# Patient Record
Sex: Female | Born: 1995 | Race: White | Hispanic: No | Marital: Single | Smoking: Former smoker
Health system: Southern US, Community
[De-identification: ages and names within clinical notes are randomized; demographics above are authoritative.]

## PROBLEM LIST (undated history)

## (undated) DIAGNOSIS — F419 Anxiety disorder, unspecified: Secondary | ICD-10-CM

## (undated) HISTORY — PX: NO PAST SURGERIES: SHX2092

---
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
## 2017-04-05 ENCOUNTER — Encounter (HOSPITAL_COMMUNITY): Payer: Self-pay | Admitting: Emergency Medicine

## 2017-04-05 ENCOUNTER — Ambulatory Visit (HOSPITAL_COMMUNITY)
Admission: EM | Admit: 2017-04-05 | Discharge: 2017-04-05 | Disposition: A | Discharge status: Home / Self Care | Payer: Federal, State, Local not specified - PPO | Attending: Internal Medicine | Admitting: Internal Medicine

## 2017-04-05 DIAGNOSIS — L255 Unspecified contact dermatitis due to plants, except food: Secondary | ICD-10-CM | POA: Diagnosis not present

## 2017-04-05 MED ORDER — PREDNISONE 10 MG (21) PO TBPK: ORAL_TABLET | Freq: Every day | ORAL | 0 refills | Status: DC

## 2017-04-05 NOTE — Discharge Instructions (Addendum)
Prescription for prednisone sent to the [facility] on [address].  Recheck for new fever >100.5, increased pain/redness, or if not starting to improve in the next 48 hours.

## 2017-04-05 NOTE — ED Triage Notes (Signed)
Here for poss poison ivy/oak onset 2 days  Has rash on bilateral arms, right ear, right leg  Denies fevers, chills  Has used OTC calamine lotion w/no relief.   A&O x4... NAD

## 2017-04-05 NOTE — ED Provider Notes (Signed)
[facility]    CSN: [id number] Arrival date & time: 04/05/17  1454     History   Chief Complaint Chief Complaint  Patient presents with  . Poison Ivy    HPI Angel Barber is a 22 y.o. female. She presents today with 2d history of blistery itchy rash, most prominent on right ear and left forearm.  Was out in the woods briefly doing yard work the day before.  New patches keep popping up.  Right ear was sore too until she started putting some neosporin on it.       HPI  History reviewed. No pertinent past medical history.   History reviewed. No pertinent surgical history.   Home Medications    Prior to Admission medications   Medication Sig Start Date End Date Taking? Authorizing Provider  predniSONE (STERAPRED UNI-PAK 21 TAB) 10 MG (21) TBPK tablet Take by mouth daily. 6 tabs daily for 2 days, 5 tabs x 2 days, 4 tabs x 2 days, 3 tabs x 2 days, 2 tabs x 2 days, then 1 tab daily x 2 days 04/05/17   Eustace Moore, MD    Family History Family History  Problem Relation Age of Onset  . Breast cancer Mother 76    negative genetic testing  . Other Father     had a colonoscopy due to bleeding; results were normal  . Breast cancer Other     dx. 50s-60s  . Breast cancer Other   . Rectal cancer Other   . Lung cancer Other   . Other Other     Social History Social History  Substance Use Topics  . Smoking status: Current Every Day Smoker    Types: Cigarettes  . Smokeless tobacco: Never Used  . Alcohol use Yes     Allergies   Patient has no known allergies.   Review of Systems Review of Systems  All other systems reviewed and are negative.    Physical Exam Triage Vital Signs ED Triage Vitals [04/05/17 1512]  Enc Vitals Group     BP 132/87     Pulse Rate 90     Resp 18     Temp 97.8 F (36.6 C)     Temp Source Oral     SpO2 98 %     Weight      Height      Pain Score 6     Pain Loc    Updated Vital Signs BP 132/87 (BP Location: Left  Arm)   Pulse 90   Temp 97.8 F (36.6 C) (Oral)   Resp 18   LMP 03/04/2017   SpO2 98%   Physical Exam  Constitutional: She is oriented to person, place, and time. No distress.  HENT:  Head: Atraumatic.  Eyes:  Conjugate gaze observed, no eye redness/discharge.  Neck: Neck supple.  Cardiovascular: Normal rate.   Pulmonary/Chest: No respiratory distress.  Abdominal: She exhibits no distension.  Musculoskeletal: Normal range of motion.  Neurological: She is alert and oriented to person, place, and time.  Skin: Skin is warm and dry.  Red papular patches, many with vesicles, scattered thickly over left forearm and rarely over right arm and legs.  Right outer ear is swollen/red, with some golden crusting.    Nursing note and vitals reviewed.    UC Treatments / Results   Procedures Procedures (including critical care time) None today   Final Clinical Impressions(s) / UC Diagnoses   Final diagnoses:  Plant  dermatitis    New Prescriptions Discharge Medication List as of 04/05/2017  3:42 PM    START taking these medications   Details  predniSONE (STERAPRED UNI-PAK 21 TAB) 10 MG (21) TBPK tablet Take by mouth daily. 6 tabs daily for 2 days, 5 tabs x 2 days, 4 tabs x 2 days, 3 tabs x 2 days, 2 tabs x 2 days, then 1 tab daily x 2 days, Starting Fri 04/05/2017, Normal       [facility] [address]  Recheck for new fever >100.5, increased pain/redness, or if not starting to improve in the next 48 hours.   Eustace Moore, MD 04/05/17 2136

## 2017-07-30 ENCOUNTER — Emergency Department (HOSPITAL_BASED_OUTPATIENT_CLINIC_OR_DEPARTMENT_OTHER): Payer: Federal, State, Local not specified - PPO

## 2017-07-30 ENCOUNTER — Encounter (HOSPITAL_BASED_OUTPATIENT_CLINIC_OR_DEPARTMENT_OTHER): Payer: Self-pay | Admitting: Emergency Medicine

## 2017-07-30 ENCOUNTER — Emergency Department (HOSPITAL_BASED_OUTPATIENT_CLINIC_OR_DEPARTMENT_OTHER)
Admission: EM | Admit: 2017-07-30 | Discharge: 2017-07-30 | Disposition: A | Discharge status: Home / Self Care | Payer: Federal, State, Local not specified - PPO | Attending: Emergency Medicine | Admitting: Emergency Medicine

## 2017-07-30 DIAGNOSIS — F17210 Nicotine dependence, cigarettes, uncomplicated: Secondary | ICD-10-CM | POA: Insufficient documentation

## 2017-07-30 DIAGNOSIS — Y999 Unspecified external cause status: Secondary | ICD-10-CM | POA: Insufficient documentation

## 2017-07-30 DIAGNOSIS — Y929 Unspecified place or not applicable: Secondary | ICD-10-CM | POA: Insufficient documentation

## 2017-07-30 DIAGNOSIS — S93401A Sprain of unspecified ligament of right ankle, initial encounter: Secondary | ICD-10-CM | POA: Diagnosis not present

## 2017-07-30 DIAGNOSIS — X58XXXA Exposure to other specified factors, initial encounter: Secondary | ICD-10-CM | POA: Diagnosis not present

## 2017-07-30 DIAGNOSIS — Y939 Activity, unspecified: Secondary | ICD-10-CM | POA: Diagnosis not present

## 2017-07-30 DIAGNOSIS — S99911A Unspecified injury of right ankle, initial encounter: Secondary | ICD-10-CM | POA: Diagnosis present

## 2017-07-30 MED ORDER — IBUPROFEN 800 MG PO TABS: 800.0000 mg | ORAL_TABLET | Freq: Three times a day (TID) | ORAL | 0 refills | Status: DC

## 2017-07-30 NOTE — ED Provider Notes (Signed)
[facility]   CSN: [id number] Arrival date & time: 07/30/17  1934     History   Chief Complaint Chief Complaint  Patient presents with  . Ankle Pain    HPI Angel Barber is a 22 y.o. female.  HPI 3 days ago patient reports that she rolled her right ankle inward and fell on it. She has no associated injury. Initially she was elevating and icing it. She reports she has been walking on it but it is or after walking on it for very long. She reports today it looks more swollen and she saw that it was becoming discolored and sought treatment. History reviewed. No pertinent past medical history.  There are no active problems to display for this patient.   History reviewed. No pertinent surgical history.  OB History    No data available       Home Medications    Prior to Admission medications   Medication Sig Start Date End Date Taking? Authorizing Provider  ibuprofen (ADVIL,MOTRIN) 800 MG tablet Take 1 tablet (800 mg total) by mouth 3 (three) times daily. 07/30/17   Arby BarrettePfeiffer, Llesenia Fogal, MD  predniSONE (STERAPRED UNI-PAK 21 TAB) 10 MG (21) TBPK tablet Take by mouth daily. 6 tabs daily for 2 days, 5 tabs x 2 days, 4 tabs x 2 days, 3 tabs x 2 days, 2 tabs x 2 days, then 1 tab daily x 2 days 04/05/17   Eustace MooreMurray, Laura W, MD    Family History Family History  Problem Relation Age of Onset  . Breast cancer Mother 9340       negative genetic testing  . Other Father        had a colonoscopy due to bleeding; results were normal  . Breast cancer Other        dx. 50s-60s  . Breast cancer Other   . Rectal cancer Other   . Lung cancer Other   . Other Other     Social History Social History  Substance Use Topics  . Smoking status: Current Every Day Smoker    Types: Cigarettes  . Smokeless tobacco: Never Used  . Alcohol use Yes     Allergies   Patient has no known allergies.   Review of Systems Review of Systems Constitutional: No fever no  chills Neurologic: No head injury no headaches  Physical Exam Updated Vital Signs BP 129/89 (BP Location: Right Arm)   Pulse 83   Temp 98 F (36.7 C) (Oral)   Resp 20   Ht 5\' 5"  (1.651 m)   Wt 74.8 kg (165 lb)   LMP 06/28/2017   SpO2 100%   BMI 27.46 kg/m   Physical Exam  Constitutional: She is oriented to person, place, and time. She appears well-developed and well-nourished. No distress.  Eyes: EOM are normal.  Pulmonary/Chest: Effort normal.  Musculoskeletal: She exhibits edema and tenderness.  Right lower extremity has moderate swelling to the area of the lateral malleolus and forefoot. Slight greenish purple discoloration of the lateral aspect of the ankle. The foot is warm and dry. To moderate tenderness to palpation at the malleolus. No edema or swelling of the lower leg or knee.  Neurological: She is alert and oriented to person, place, and time. No cranial nerve deficit. She exhibits normal muscle tone. Coordination normal.  Skin: Skin is warm and dry.  Psychiatric: She has a normal mood and affect.     ED Treatments / Results  Labs (all labs  ordered are listed, but only abnormal results are displayed) Labs Reviewed - No data to display  EKG  EKG Interpretation None       Radiology Dg Ankle Complete Right  Result Date: 07/30/2017 CLINICAL DATA:  Right ankle pain/injury 4 days ago EXAM: RIGHT ANKLE - COMPLETE 3+ VIEW COMPARISON:  None. FINDINGS: No fracture or dislocation is seen. The ankle mortise is intact. The base of the fifth metatarsal is unremarkable. Mild lateral soft tissue swelling. IMPRESSION: No fracture or dislocation is seen. Mild lateral soft tissue swelling. Electronically Signed   By: Charline BillsSriyesh  Krishnan M.D.   On: 07/30/2017 20:11    Procedures Procedures (including critical care time)  Medications Ordered in ED Medications - No data to display   Initial Impression / Assessment and Plan / ED Course  I have reviewed the triage vital signs  and the nursing notes.  Pertinent labs & imaging results that were available during my care of the patient were reviewed by me and considered in my medical decision making (see chart for details).      Final Clinical Impressions(s) / ED Diagnoses   Final diagnoses:  Sprain of right ankle, unspecified ligament, initial encounter   X-ray negative. Findings consistent with lateral ankle sprain. Patient is neurovascularly intact. At this time will have patient compress with an Ace wrap and use a Cam Walker. She is counseled on elevating and icing and follow-up plan. New Prescriptions New Prescriptions   IBUPROFEN (ADVIL,MOTRIN) 800 MG TABLET    Take 1 tablet (800 mg total) by mouth 3 (three) times daily.     Arby BarrettePfeiffer, Netasha Wehrli, MD 07/30/17 2048

## 2017-07-30 NOTE — ED Triage Notes (Signed)
Patient states that she fell a few days ago and hurt her right ankle. Patient is concerned because she feels like something is stretching in it now and she has brusing

## 2017-09-13 ENCOUNTER — Encounter (HOSPITAL_BASED_OUTPATIENT_CLINIC_OR_DEPARTMENT_OTHER): Payer: Self-pay

## 2017-09-13 ENCOUNTER — Emergency Department (HOSPITAL_BASED_OUTPATIENT_CLINIC_OR_DEPARTMENT_OTHER)
Admission: EM | Admit: 2017-09-13 | Discharge: 2017-09-13 | Disposition: A | Discharge status: Home / Self Care | Payer: Federal, State, Local not specified - PPO | Attending: Emergency Medicine | Admitting: Emergency Medicine

## 2017-09-13 ENCOUNTER — Emergency Department (HOSPITAL_BASED_OUTPATIENT_CLINIC_OR_DEPARTMENT_OTHER): Payer: Federal, State, Local not specified - PPO

## 2017-09-13 DIAGNOSIS — F17210 Nicotine dependence, cigarettes, uncomplicated: Secondary | ICD-10-CM | POA: Diagnosis not present

## 2017-09-13 DIAGNOSIS — J189 Pneumonia, unspecified organism: Secondary | ICD-10-CM | POA: Diagnosis not present

## 2017-09-13 DIAGNOSIS — R05 Cough: Secondary | ICD-10-CM | POA: Diagnosis present

## 2017-09-13 MED ORDER — AZITHROMYCIN 250 MG PO TABS: 250.0000 mg | ORAL_TABLET | Freq: Every day | ORAL | 0 refills | Status: DC

## 2017-09-13 MED ORDER — IBUPROFEN 400 MG PO TABS
600.0000 mg | ORAL_TABLET | Freq: Once | ORAL | Status: AC
  Administered 2017-09-13: 17:00:00 600 mg via ORAL
  Filled 2017-09-13: qty 1

## 2017-09-13 NOTE — ED Provider Notes (Signed)
[facility]   CSN: [id number] Arrival date & time: 09/13/17  1559     History   Chief Complaint Chief Complaint  Patient presents with  . Cough    HPI Angel Barber is a 22 y.o. female.  HPI  22 year old female presents with chest pain that started last night. She states she's had a cough with clear sputum for about one week. No fevers. She has had some rhinorrhea and about 3 days ago started having left-sided sore throat that lasted for about 2 days. It is gone now. She had some mild neck swelling under her left jaw that she thinks has resolved. If felt point tender to her. Since last night has developed chest pain in her upper mid chest. Seems to worsen if she coughs or moves. No significant shortness of breath. No vomiting. No leg pain, leg swelling, DVT/PE history or recent travel or surgery. She has not a birth control. No miss menstrual cycles.  History reviewed. No pertinent past medical history.  There are no active problems to display for this patient.   History reviewed. No pertinent surgical history.  OB History    No data available       Home Medications    Prior to Admission medications   Medication Sig Start Date End Date Taking? Authorizing Provider  azithromycin (ZITHROMAX) 250 MG tablet Take 1 tablet (250 mg total) by mouth daily. Take first 2 tablets together, then 1 every day until finished. 09/13/17   Pricilla Loveless, MD    Family History Family History  Problem Relation Age of Onset  . Breast cancer Mother 24       negative genetic testing  . Other Father        had a colonoscopy due to bleeding; results were normal  . Breast cancer Other        dx. 50s-60s  . Breast cancer Other   . Rectal cancer Other   . Lung cancer Other   . Other Other     Social History Social History  Substance Use Topics  . Smoking status: Current Every Day Smoker    Types: Cigarettes  . Smokeless tobacco: Never Used  . Alcohol use  Yes     Comment: occ     Allergies   Patient has no known allergies.   Review of Systems Review of Systems  Constitutional: Negative for fever.  HENT: Positive for rhinorrhea. Negative for sore throat.   Respiratory: Positive for cough.   Cardiovascular: Positive for chest pain.  Gastrointestinal: Negative for vomiting.  Musculoskeletal: Positive for neck pain.  All other systems reviewed and are negative.    Physical Exam Updated Vital Signs BP 121/79   Pulse 88   Temp 98.2 F (36.8 C) (Oral)   Resp 18   Ht  (1.651 m)   Wt 81 kg (178 lb 9.2 oz)   LMP  (LMP Unknown)   SpO2 98%   BMI 29.72 kg/m   Physical Exam  Constitutional: She is oriented to person, place, and time. She appears well-developed and well-nourished.  HENT:  Head: Normocephalic and atraumatic.  Right Ear: External ear normal.  Left Ear: External ear normal.  Nose: Nose normal.  Mouth/Throat: Oropharynx is clear and moist. No oropharyngeal exudate.  Eyes: Right eye exhibits no discharge. Left eye exhibits no discharge.  Neck: Normal range of motion. Neck supple.  No neck swelling or skin changes appreciated  Cardiovascular: Normal rate, regular rhythm and  normal heart sounds.   Pulmonary/Chest: Effort normal and breath sounds normal. She has no wheezes. She has no rales. She exhibits tenderness (mild).    Abdominal: Soft. There is no tenderness.  Lymphadenopathy:    She has no cervical adenopathy.  Neurological: She is alert and oriented to person, place, and time.  Skin: Skin is warm and dry.  Nursing note and vitals reviewed.    ED Treatments / Results  Labs (all labs ordered are listed, but only abnormal results are displayed) Labs Reviewed - No data to display  EKG  EKG Interpretation  Date/Time:  Friday September 13 2017 16:35:27 EDT Ventricular Rate:  62 PR Interval:    QRS Duration: 79 QT Interval:  377 QTC Calculation: 383 R Axis:   70 Text Interpretation:  Sinus  rhythm Borderline short PR interval Early repolarization No old tracing to compare Confirmed by Pricilla Loveless [phone]) on 09/13/2017 4:37:39 PM       Radiology Dg Chest 2 View  Result Date: 09/13/2017 CLINICAL DATA:  Productive cough over the last week. Swelling of the left side of the neck. EXAM: CHEST  2 VIEW COMPARISON:  None. FINDINGS: Heart size is normal. Mediastinal shadows are normal. Question mild patchy infiltrate in the right lower lobe. No definite consolidation. No collapse or effusion. Bony structures unremarkable. IMPRESSION: Normal versus mild patchy infiltrate in the right lower lobe. Electronically Signed   By: Paulina Fusi M.D.   On: 09/13/2017 17:52    Procedures Procedures (including critical care time)  Medications Ordered in ED Medications  ibuprofen (ADVIL,MOTRIN) tablet 600 mg (600 mg Oral Given 09/13/17 1633)     Initial Impression / Assessment and Plan / ED Course  I have reviewed the triage vital signs and the nursing notes.  Pertinent labs & imaging results that were available during my care of the patient were reviewed by me and considered in my medical decision making (see chart for details).     Based on chest x-ray findings, will treat for atypical pneumonia with azithromycin. She is otherwise well appearing. Her chest x-ray shows no other significant abnormalities on her ECG is benign. Her chest pain is likely muscular from coughing. I highly doubt ACS, PE, or dissection. She is low risk for PE and PERC negative. Discharge home with return precautions.  Final Clinical Impressions(s) / ED Diagnoses   Final diagnoses:  Atypical pneumonia    New Prescriptions Discharge Medication List as of 09/13/2017  6:00 PM    START taking these medications   Details  azithromycin (ZITHROMAX) 250 MG tablet Take 1 tablet (250 mg total) by mouth daily. Take first 2 tablets together, then 1 every day until finished., Starting Fri 09/13/2017, Print           Pricilla Loveless, MD 09/13/17 2253

## 2017-09-13 NOTE — ED Notes (Signed)
ED Provider at bedside. 

## 2017-09-13 NOTE — ED Triage Notes (Signed)
C/o prod cough x 1 week-swelling to left side of neck and chest sore to touch x 2 days-NAD-steady gait

## 2017-10-11 ENCOUNTER — Ambulatory Visit (HOSPITAL_COMMUNITY)
Admission: EM | Admit: 2017-10-11 | Discharge: 2017-10-11 | Disposition: A | Discharge status: Home / Self Care | Payer: Federal, State, Local not specified - PPO | Attending: Family | Admitting: Family

## 2017-10-11 ENCOUNTER — Ambulatory Visit (INDEPENDENT_AMBULATORY_CARE_PROVIDER_SITE_OTHER): Payer: Federal, State, Local not specified - PPO

## 2017-10-11 ENCOUNTER — Encounter (HOSPITAL_COMMUNITY): Payer: Self-pay | Admitting: Emergency Medicine

## 2017-10-11 DIAGNOSIS — S82891A Other fracture of right lower leg, initial encounter for closed fracture: Secondary | ICD-10-CM | POA: Diagnosis not present

## 2017-10-11 NOTE — ED Triage Notes (Signed)
Pt reports she inj  Her right ankle 2 days.... sts she stepped on a pothole and twisted ankle.  Sx today include: swelling, pain.... Pain increases w/activity   A&O x4... NAd... Ambulatory

## 2017-10-11 NOTE — Discharge Instructions (Signed)
Crutches Boot Non weight bearing until evaluated by orthopedics Ibuprofen OTC, Ice, Ace Wrap Call orthopedics today and schedule appointment  If there is no improvement in your symptoms, or if there is any worsening of symptoms, or if you have any additional concerns, please return for re-evaluation; or, if we are closed, consider going to the Emergency Room for evaluation if symptoms urgent.

## 2017-10-11 NOTE — ED Provider Notes (Signed)
[facility]    CSN: [id number] Arrival date & time: 10/11/17  1120     History   Chief Complaint Chief Complaint  Patient presents with  . Ankle Pain    HPI Angel Barber is a 22 y.o. female.   Chief complaint of right ankle pain, unchanged. Patient describes an injury today 2 days ago she stepped in "a pot hole" and twisted ankle with inversion. Didn't recalls any cracks or pops during injury. No head injury. She describes swelling, pain. Pain increases with activity, worse with walking. Throbs when sitting still.  Tried ice, elevation, ibuprofen with  No relief.  2 notes months ago similar injury to right ankle; no fracture.       History reviewed. No pertinent past medical history.  There are no active problems to display for this patient.   History reviewed. No pertinent surgical history.  OB History    No data available       Home Medications    Prior to Admission medications   Medication Sig Start Date End Date Taking? Authorizing Provider  azithromycin (ZITHROMAX) 250 MG tablet Take 1 tablet (250 mg total) by mouth daily. Take first 2 tablets together, then 1 every day until finished. 09/13/17   Pricilla Loveless, MD    Family History Family History  Problem Relation Age of Onset  . Breast cancer Mother 23       negative genetic testing  . Other Father        had a colonoscopy due to bleeding; results were normal  . Breast cancer Other        dx. 50s-60s  . Breast cancer Other   . Rectal cancer Other   . Lung cancer Other   . Other Other     Social History Social History  Substance Use Topics  . Smoking status: Current Every Day Smoker    Types: Cigarettes  . Smokeless tobacco: Never Used  . Alcohol use Yes     Comment: occ     Allergies   Patient has no known allergies.   Review of Systems Review of Systems  Constitutional: Negative for chills and fever.  Respiratory: Negative for cough and shortness of breath.     Cardiovascular: Negative for chest pain and palpitations.  Gastrointestinal: Negative for nausea and vomiting.  Musculoskeletal: Positive for joint swelling.     Physical Exam Triage Vital Signs ED Triage Vitals  Enc Vitals Group     BP 10/11/17 1152 (!) 148/80     Pulse Rate 10/11/17 1152 93     Resp 10/11/17 1152 20     Temp 10/11/17 1152 98 F (36.7 C)     Temp Source 10/11/17 1152 Oral     SpO2 10/11/17 1152 100 %     Weight --      Height --      Head Circumference --      Peak Flow --      Pain Score 10/11/17 1154 8     Pain Loc --      Pain Edu? --      Excl. in GC? --    No data found.   Updated Vital Signs BP (!) 148/80 (BP Location: Left Arm)   Pulse 93   Temp 98 F (36.7 C) (Oral)   Resp 20   LMP 10/04/2017 (Exact Date)   SpO2 100%   Visual Acuity Right Eye Distance:   Left Eye Distance:   Bilateral Distance:  Right Eye Near:   Left Eye Near:    Bilateral Near:     Physical Exam  Constitutional: She appears well-developed and well-nourished.  Eyes: Conjunctivae are normal.  Cardiovascular: Normal rate, regular rhythm, normal heart sounds and normal pulses.   Pulmonary/Chest: Effort normal and breath sounds normal. She has no wheezes. She has no rhonchi. She has no rales.  Musculoskeletal:       Right ankle: She exhibits decreased range of motion and swelling. She exhibits no ecchymosis. Tenderness. Lateral malleolus tenderness found.  No pain with Squeeze test at mid calf. Pain over lateral malleolus. No pain over medial malleolus, base of the fifth metatarsal or navicular bone.   Pain with plantar and dorsi flexion.   No swellling or asymmetry of calves. Sensation intact equally bilateral lower extremities. Palpable pedal pulses.   Neurological: She is alert.  Skin: Skin is warm and dry.  Psychiatric: She has a normal mood and affect. Her speech is normal and behavior is normal. Thought content normal.  Vitals reviewed.    UC  Treatments / Results  Labs (all labs ordered are listed, but only abnormal results are displayed) Labs Reviewed - No data to display  EKG  EKG Interpretation None       Radiology Dg Ankle Complete Right  Result Date: 10/11/2017 CLINICAL DATA:  Right ankle pain/ injury EXAM: RIGHT ANKLE - COMPLETE 3+ VIEW COMPARISON:  None. FINDINGS: 3 mm osseous density adjacent to the lateral malleolus may reflect a tiny avulsion fracture. Associated lateral soft tissue swelling. The ankle mortise is intact. The base of the fifth metatarsal is unremarkable. IMPRESSION: Possible tiny avulsion fracture involving the lateral malleolus. Associated lateral soft tissue swelling. Electronically Signed   By: Charline Bills M.D.   On: 10/11/2017 12:14    Procedures Procedures (including critical care time)  Medications Ordered in UC Medications - No data to display   Initial Impression / Assessment and Plan / UC Course  I have reviewed the triage vital signs and the nursing notes.  Pertinent labs & imaging results that were available during my care of the patient were reviewed by me and considered in my medical decision making (see chart for details).       Final Clinical Impressions(s) / UC Diagnoses   Final diagnoses:  Closed avulsion fracture of right ankle, initial encounter   Avulsion fracture not noted on prior x-rays done in July of this year. Advised patient to be nonweightbearing until she is evaluated by orthopedics. She has a boot from this [employer] which she will wear. Given her crutches today. Advised ice, ibuprofen. Advised no driving. Patient understands to follow with orthopedics.  New Prescriptions New Prescriptions   No medications on file     Controlled Substance Prescriptions [place] Controlled Substance Registry consulted? Not Applicable   Allegra Grana, FNP 10/11/17 1236

## 2018-03-31 ENCOUNTER — Encounter (HOSPITAL_COMMUNITY): Payer: Self-pay | Admitting: *Deleted

## 2018-03-31 ENCOUNTER — Emergency Department (HOSPITAL_COMMUNITY): Payer: Federal, State, Local not specified - PPO

## 2018-03-31 ENCOUNTER — Emergency Department (HOSPITAL_COMMUNITY)
Admission: EM | Admit: 2018-03-31 | Discharge: 2018-03-31 | Disposition: A | Discharge status: Home / Self Care | Payer: Federal, State, Local not specified - PPO | Attending: Emergency Medicine | Admitting: Emergency Medicine

## 2018-03-31 ENCOUNTER — Other Ambulatory Visit: Payer: Self-pay

## 2018-03-31 DIAGNOSIS — R197 Diarrhea, unspecified: Secondary | ICD-10-CM | POA: Insufficient documentation

## 2018-03-31 DIAGNOSIS — R0789 Other chest pain: Secondary | ICD-10-CM | POA: Insufficient documentation

## 2018-03-31 DIAGNOSIS — R112 Nausea with vomiting, unspecified: Secondary | ICD-10-CM | POA: Diagnosis not present

## 2018-03-31 DIAGNOSIS — F17210 Nicotine dependence, cigarettes, uncomplicated: Secondary | ICD-10-CM | POA: Insufficient documentation

## 2018-03-31 DIAGNOSIS — R062 Wheezing: Secondary | ICD-10-CM | POA: Diagnosis not present

## 2018-03-31 DIAGNOSIS — J029 Acute pharyngitis, unspecified: Secondary | ICD-10-CM | POA: Diagnosis not present

## 2018-03-31 DIAGNOSIS — M79602 Pain in left arm: Secondary | ICD-10-CM | POA: Insufficient documentation

## 2018-03-31 DIAGNOSIS — R0981 Nasal congestion: Secondary | ICD-10-CM | POA: Insufficient documentation

## 2018-03-31 DIAGNOSIS — R05 Cough: Secondary | ICD-10-CM | POA: Diagnosis not present

## 2018-03-31 DIAGNOSIS — R000 Tachycardia, unspecified: Secondary | ICD-10-CM | POA: Diagnosis not present

## 2018-03-31 DIAGNOSIS — R059 Cough, unspecified: Secondary | ICD-10-CM

## 2018-03-31 DIAGNOSIS — R1084 Generalized abdominal pain: Secondary | ICD-10-CM | POA: Insufficient documentation

## 2018-03-31 LAB — COMPREHENSIVE METABOLIC PANEL
ALBUMIN: 4 g/dL (ref 3.5–5.0)
ALT: 28 U/L (ref 14–54)
AST: 28 U/L (ref 15–41)
Alkaline Phosphatase: 75 U/L (ref 38–126)
Anion gap: 13 (ref 5–15)
BILIRUBIN TOTAL: 0.6 mg/dL (ref 0.3–1.2)
BUN: 11 mg/dL (ref 6–20)
CO2: 22 mmol/L (ref 22–32)
CREATININE: 0.88 mg/dL (ref 0.44–1.00)
Calcium: 9 mg/dL (ref 8.9–10.3)
Chloride: 101 mmol/L (ref 101–111)
GFR calc Af Amer: >60 mL/min (ref >60)
GLUCOSE: 110 mg/dL — AB (ref 65–99)
Potassium: 4.1 mmol/L (ref 3.5–5.1)
Sodium: 136 mmol/L (ref 135–145)
Total Protein: 7 g/dL (ref 6.5–8.1)

## 2018-03-31 LAB — URINALYSIS, ROUTINE W REFLEX MICROSCOPIC
Bilirubin Urine: NEGATIVE
Glucose, UA: NEGATIVE mg/dL
Ketones, ur: NEGATIVE mg/dL
LEUKOCYTES UA: NEGATIVE
Nitrite: NEGATIVE
PH: 5 (ref 5.0–8.0)
Protein, ur: NEGATIVE mg/dL
SPECIFIC GRAVITY, URINE: 1.016 (ref 1.005–1.030)

## 2018-03-31 LAB — CBC
HEMATOCRIT: 42.6 % (ref 36.0–46.0)
Hemoglobin: 14.1 g/dL (ref 12.0–15.0)
MCH: 29.9 pg (ref 26.0–34.0)
MCHC: 33.1 g/dL (ref 30.0–36.0)
MCV: 90.4 fL (ref 78.0–100.0)
PLATELETS: 234 10*3/uL (ref 150–400)
RBC: 4.71 MIL/uL (ref 3.87–5.11)
RDW: 13.5 % (ref 11.5–15.5)
WBC: 14.3 10*3/uL — AB (ref 4.0–10.5)

## 2018-03-31 LAB — LIPASE, BLOOD: Lipase: 31 U/L (ref 11–51)

## 2018-03-31 LAB — I-STAT BETA HCG BLOOD, ED (MC, WL, AP ONLY): I-stat hCG, quantitative: <5 m[IU]/mL (ref <5)

## 2018-03-31 MED ORDER — KETOROLAC TROMETHAMINE 30 MG/ML IJ SOLN
30.0000 mg | Freq: Once | INTRAMUSCULAR | Status: AC
  Administered 2018-03-31: 30 mg via INTRAVENOUS
  Filled 2018-03-31: qty 1

## 2018-03-31 MED ORDER — DICYCLOMINE HCL 20 MG PO TABS: 20.0000 mg | ORAL_TABLET | Freq: Three times a day (TID) | ORAL | 0 refills | Status: DC | PRN

## 2018-03-31 MED ORDER — SODIUM CHLORIDE 0.9 % IV BOLUS
2000.0000 mL | Freq: Once | INTRAVENOUS | Status: AC
  Administered 2018-03-31: 2000 mL via INTRAVENOUS

## 2018-03-31 MED ORDER — OMEPRAZOLE 10 MG PO CPDR: 10.0000 mg | DELAYED_RELEASE_CAPSULE | Freq: Every day | ORAL | 0 refills | Status: DC

## 2018-03-31 MED ORDER — ONDANSETRON 4 MG PO TBDP: 4.0000 mg | ORAL_TABLET | Freq: Three times a day (TID) | ORAL | 0 refills | Status: DC | PRN

## 2018-03-31 NOTE — ED Provider Notes (Signed)
[facility] EMERGENCY DEPARTMENT Provider Note   CSN: [id number] Arrival date & time: 03/31/18  0346  History   Chief Complaint Chief Complaint  Patient presents with  . Abdominal Pain    HPI Angel Barber is a 23 y.o. female.  HPI   Patient presents with abdominal and L flank pain. Intermittent over past few weeks. Abdominal pain is generalized. Reports 3-4 episodes of vomiting over this time, typically after drinking 3-4 beers. Notes "spots of blood" in emesis during these episodes. No vomiting otherwise, but does endorse nausea. Last episode of vomiting ~5hrs ago. Endorses intermittent diarrhea as well. Last regular BM 1hr ago. Endorses decreased appetite. Drinks primarily water, however this worsens her stomach pain, so she has not been drinking much recently. Last had anything to eat or drink ~14hrs ago. Took OTC pain med her mother gave her yesterday for her flank pain but this was not helpful and is the only thing she has taken to try to improve these symptoms. LMP two weeks ago. Is sexually active and does not use any form of contraception. Denies vaginal discharge, dysuria, hematuria, increased urinary frequency. Also endorses cough for roughly the past month with accompanying wheezing at times. Wheeze clears with coughing. Cough productive of white sputum. Endorses chest pain after coughing a lot but no chest pain otherwise. Denies SOB, difficulty breathing. Says she had similar sx last year and was diagnosed with PNA and given prednisone and azithro. Had some prednisone tabs left which she took to help with cough; helped somewhat but did not resolve symptoms. Has not taken anything else to help with symptoms. Endorses nasal congestion, sore throat when coughing. Denies fevers.   History reviewed. No pertinent past medical history.  There are no active problems to display for this patient.   History reviewed. No pertinent surgical history.   OB History    None      Home Medications    Prior to Admission medications   Medication Sig Start Date End Date Taking? Authorizing Provider  dicyclomine (BENTYL) 20 MG tablet Take 1 tablet (20 mg total) by mouth 3 (three) times daily as needed for spasms. 03/31/18 03/31/19  Marquette Saa, MD  omeprazole (PRILOSEC) 10 MG capsule Take 1 capsule (10 mg total) by mouth daily. 03/31/18 04/30/18  Marquette Saa, MD  ondansetron (ZOFRAN-ODT) 4 MG disintegrating tablet Take 1 tablet (4 mg total) by mouth every 8 (eight) hours as needed for nausea or vomiting. 03/31/18   Marquette Saa, MD    Family History Family History  Problem Relation Age of Onset  . Breast cancer Mother 21       negative genetic testing  . Other Father        had a colonoscopy due to bleeding; results were normal  . Breast cancer Other        dx. 50s-60s  . Breast cancer Other   . Rectal cancer Other   . Lung cancer Other   . Other Other     Social History Social History   Tobacco Use  . Smoking status: Current Every Day Smoker    Types: Cigarettes  . Smokeless tobacco: Never Used  Substance Use Topics  . Alcohol use: Yes    Comment: occ  . Drug use: No     Allergies   Patient has no known allergies.   Review of Systems Review of Systems  Constitutional: Positive for appetite change. Negative for fever.  HENT: Positive for  rhinorrhea and sore throat.        "Clogged ears"  Respiratory: Positive for cough and wheezing. Negative for shortness of breath.   Cardiovascular: Negative for chest pain.  Gastrointestinal: Positive for abdominal pain, diarrhea, nausea and vomiting. Negative for constipation.  Genitourinary: Positive for flank pain. Negative for dysuria, frequency, hematuria, pelvic pain, urgency and vaginal discharge.     Physical Exam Updated Vital Signs BP 117/67 (BP Location: Right Arm)   Pulse (!) 101   Temp 99.1 F (37.3 C) (Oral)   Resp 16   Ht 5\' 5"  (1.651 m)    Wt 81.6 kg (180 lb)   LMP 03/09/2018   SpO2 99%   BMI 29.95 kg/m   Physical Exam  Constitutional: She is oriented to person, place, and time. She appears well-developed and well-nourished. No distress.  HENT:  Head: Normocephalic and atraumatic.  Right Ear: External ear normal.  Left Ear: External ear normal.  Nose: Nose normal.  Mouth/Throat: Oropharynx is clear and moist. No oropharyngeal exudate.  Eyes: Pupils are equal, round, and reactive to light. Conjunctivae and EOM are normal. Right eye exhibits no discharge. Left eye exhibits no discharge.  Neck: Normal range of motion. Neck supple.  Cardiovascular: Regular rhythm, normal heart sounds and intact distal pulses.  No murmur heard. Tachycardic  Pulmonary/Chest: Effort normal and breath sounds normal. No respiratory distress. She has no wheezes.  Abdominal: Soft. Normal appearance and bowel sounds are normal. She exhibits no mass. There is no tenderness. There is no guarding and no CVA tenderness.  Mild TTP L flank.   Musculoskeletal:  5/5 strength upper and lower extremities bilaterally  Lymphadenopathy:    She has no cervical adenopathy.  Neurological: She is alert and oriented to person, place, and time.  Skin: Skin is warm and dry. Capillary refill takes less than 2 seconds. No rash noted.  Psychiatric: She has a normal mood and affect. Her behavior is normal.  Nursing note and vitals reviewed.    ED Treatments / Results  Labs (all labs ordered are listed, but only abnormal results are displayed) Labs Reviewed  COMPREHENSIVE METABOLIC PANEL - Abnormal; Notable for the following components:      Result Value   Glucose, Bld 110 (*)    All other components within normal limits  CBC - Abnormal; Notable for the following components:   WBC 14.3 (*)    All other components within normal limits  URINALYSIS, ROUTINE W REFLEX MICROSCOPIC - Abnormal; Notable for the following components:   Hgb urine dipstick SMALL (*)     Bacteria, UA RARE (*)    Squamous Epithelial / LPF 0-5 (*)    All other components within normal limits  LIPASE, BLOOD  I-STAT BETA HCG BLOOD, ED ([facility], [facility], [facility] ONLY)    EKG EKG Interpretation  Date/Time:  Monday March 31 2018 04:00:56 EDT Ventricular Rate:  136 PR Interval:  118 QRS Duration: 72 QT Interval:  270 QTC Calculation: 406 R Axis:   81 Text Interpretation:  Sinus tachycardia Abnormal ekg Confirmed by Gerhard MunchLockwood, Robert [id number]) on 03/31/2018 8:25:56 AM   Radiology Dg Chest 2 View  Result Date: 03/31/2018 CLINICAL DATA:  Left-sided chest pain with cough, initial encounter EXAM: CHEST - 2 VIEW COMPARISON:  09/13/2017 FINDINGS: The heart size and mediastinal contours are within normal limits. Both lungs are clear. The visualized skeletal structures are unremarkable. IMPRESSION: No active cardiopulmonary disease. Electronically Signed   By: Alcide CleverMark  Lukens M.D.   On: 03/31/2018 09:47  Ct Renal Stone Study  Result Date: 03/31/2018 CLINICAL DATA:  One month history of left flank pain. Nausea and vomiting EXAM: CT ABDOMEN AND PELVIS WITHOUT CONTRAST TECHNIQUE: Multidetector CT imaging of the abdomen and pelvis was performed following the standard protocol without oral or IV contrast. COMPARISON:  None. FINDINGS: Lower chest: Lung bases are clear. Hepatobiliary: Liver measures 20.2 cm in length. No focal liver lesions are evident on this noncontrast enhanced study. Gallbladder wall is not appreciably thickened. There is no biliary duct dilatation. Pancreas: There is no pancreatic mass or inflammatory focus. Spleen: No splenic lesions are evident. Adrenals/Urinary Tract: Adrenals bilaterally appear normal. There is no apparent renal mass or hydronephrosis on either side. There is a junctional parenchymal defect in each kidney, an anatomic variant. There is no renal or ureteral calculus on either side. Urinary bladder is midline with wall thickness within normal limits. Stomach/Bowel: There is no  appreciable bowel wall or mesenteric thickening. There is no evident bowel obstruction. No free air or portal venous air. Vascular/Lymphatic: There is no abdominal aortic aneurysm. There are no vascular lesions evident. By size criteria, there is no adenopathy in the abdomen or pelvis. There are scattered subcentimeter lymph nodes in the right mid to lower abdomen, regarded as nonspecific. Reproductive: Uterus is anteverted.  No pelvic mass evident. Other: Appendix appears normal. No abscess or ascites is evident in the abdomen or pelvis. Musculoskeletal: No blastic or lytic bone lesions. No intramuscular or abdominal wall lesions are evident. IMPRESSION: 1. No evident renal or ureteral calculus. No hydronephrosis on either side. 2.  Prominent liver without focal lesion. 3. No evident bowel obstruction. No abscess. Appendix appears normal. 4. Scattered right-sided subcentimeter abdominal mesenteric lymph nodes regarded as nonspecific. No adenopathy evident on this study by size criteria. Electronically Signed   By: Bretta Bang III M.D.   On: 03/31/2018 10:11    Procedures Procedures (including critical care time)  Medications Ordered in ED Medications  ketorolac (TORADOL) 30 MG/ML injection 30 mg (has no administration in time range)  sodium chloride 0.9 % bolus 2,000 mL (0 mLs Intravenous Stopped 03/31/18 0934)     Initial Impression / Assessment and Plan / ED Course  I have reviewed the triage vital signs and the nursing notes.  Pertinent labs & imaging results that were available during my care of the patient were reviewed by me and considered in my medical decision making (see chart for details).    23yo F presenting with intermittent generalized abd pain and L flank pain over past 2w. Also with cough x4w. Labs remarkable for leukocytosis (WBC 14) and small Hgb in UA. Also tachycardic to 120s, though afebrile and well-appearing. Given intermittent flank pain, leukocytosis, and hematuria,  will obtain CT renal study to r/o nephrolithiasis. Also reporting decreased PO intake, making dehydration possible cause of tachycardia, or at least contributor, as well, though MMM on exam. Patient does report recently taking prednisone, which could be contributing to leukocytosis as well.   Upreg neg. Denies dysuria, no suprapubic or CVA tenderness, and UA with no signs of infection, so UTI less likely. Also denies vaginal discharge or pelvic pain, so STD less likely. Intermittent nature of vomiting, especially after consuming alcohol, could be consistent with gastritis. Gastric ulcer also on differential, so could consider beginning PPI if CT neg for renal etiology.   PE on differential given tachycardia and reported cough, however normal WOB on RA with normal O2 sat, no chest pain, no risk factors, and lungs  CTAB, so PE less likely. PNA also less likely as patient afebrile with clear lungs, however will obtain CXR to rule out consolidation.   0865 CXR with no abnormalities.   1026 CT with no hydronephrosis or signs of renal calculi. Prominent liver noted but without lesions or other abnormalities. HR improved with IVF. Patient continues to appear well but is requesting pain medication; will given Toradol x1 prior to discharge. Will discharge on PPI as well as Bentyl for abd cramping. Can taken ibuprofen or Tylenol for abd pain and flank pain. Discharge with Zofran ODT as well for N/V. Encouraged to establish care with PCP.    Final Clinical Impressions(s) / ED Diagnoses   Final diagnoses:  Generalized abdominal pain  Cough    ED Discharge Orders        Ordered    dicyclomine (BENTYL) 20 MG tablet  3 times daily PRN     03/31/18 1050    ondansetron (ZOFRAN-ODT) 4 MG disintegrating tablet  Every 8 hours PRN     03/31/18 1050    omeprazole (PRILOSEC) 10 MG capsule  Daily     03/31/18 1050     Tarri Abernethy, MD, MPH PGY-3 [facility] Pager [phone]    Marquette Saa, MD 03/31/18 1056    Gerhard Munch, MD 03/31/18 [phone]

## 2018-03-31 NOTE — Discharge Instructions (Signed)
To help with your abdominal and side pain, you can take ibuprofen and/or Tylenol as needed. You can also take Bentyl up to every 8 hours as needed for abdominal cramping or pain. For nausea, you can take one Zofran tablet up to every 8 hours.  Please also begin taking omeprazole (Prilosec) one tablet daily to help with any possible stomach ulcers or acid reflux.  Please call to schedule an appointment with a primary care doctor. [facility] [phone]) and [facility] [phone]) are both accepting new patients currently.  If your abdominal pain or vomiting worsens, please return to the emergency room.

## 2018-03-31 NOTE — ED Triage Notes (Signed)
The pt is c/o abd pain for one month with vomiting and diarrhea  lmp 2 weeks ago also lt rib pain lower

## 2018-06-19 ENCOUNTER — Encounter (HOSPITAL_COMMUNITY): Payer: Self-pay | Admitting: *Deleted

## 2018-06-19 ENCOUNTER — Emergency Department (HOSPITAL_COMMUNITY)
Admission: EM | Admit: 2018-06-19 | Discharge: 2018-06-19 | Disposition: A | Discharge status: Home / Self Care | Payer: Federal, State, Local not specified - PPO | Attending: Emergency Medicine | Admitting: Emergency Medicine

## 2018-06-19 ENCOUNTER — Emergency Department (HOSPITAL_COMMUNITY): Payer: Federal, State, Local not specified - PPO

## 2018-06-19 ENCOUNTER — Other Ambulatory Visit: Payer: Self-pay

## 2018-06-19 DIAGNOSIS — Y999 Unspecified external cause status: Secondary | ICD-10-CM | POA: Diagnosis not present

## 2018-06-19 DIAGNOSIS — F17210 Nicotine dependence, cigarettes, uncomplicated: Secondary | ICD-10-CM | POA: Insufficient documentation

## 2018-06-19 DIAGNOSIS — Y9339 Activity, other involving climbing, rappelling and jumping off: Secondary | ICD-10-CM | POA: Diagnosis not present

## 2018-06-19 DIAGNOSIS — S8992XA Unspecified injury of left lower leg, initial encounter: Secondary | ICD-10-CM | POA: Diagnosis present

## 2018-06-19 DIAGNOSIS — X500XXA Overexertion from strenuous movement or load, initial encounter: Secondary | ICD-10-CM | POA: Diagnosis not present

## 2018-06-19 DIAGNOSIS — Y92511 Restaurant or cafe as the place of occurrence of the external cause: Secondary | ICD-10-CM | POA: Insufficient documentation

## 2018-06-19 MED ORDER — HYDROCODONE-ACETAMINOPHEN 5-325 MG PO TABS: 2.0000 | ORAL_TABLET | ORAL | 0 refills | Status: DC | PRN
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
## 2018-06-19 NOTE — Discharge Instructions (Addendum)
Your x-ray shows signs of possible ligament injury to your left knee.  Wear the knee immobilizer at all times.  Use the crutches for nonweightbearing. Please rest, ice, compress and elevated the affected body part to help with swelling and pain.  I would take anti-inflammatory such as Aleve, Motrin and ibuprofen around-the-clock. Have given you short course of stronger pain medication that will make you drowsy so do not drive with it.  It is very important for you to follow-up with orthopedic doctor.  Have provided their contact information.  Return the ED if you develop any worsening symptoms.
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
## 2018-11-24 IMAGING — CR DG KNEE COMPLETE 4+V*L*
4 series · 4 of 4 positions shown · non-contrast
Comparison: None.

CLINICAL DATA: Patient was picked up and then put down. Patient
esophagitis sting injury and currently cannot bear weight. Pain is
just below the patella.

EXAM:
LEFT KNEE - COMPLETE 4+ VIEW

[x knee ap left]
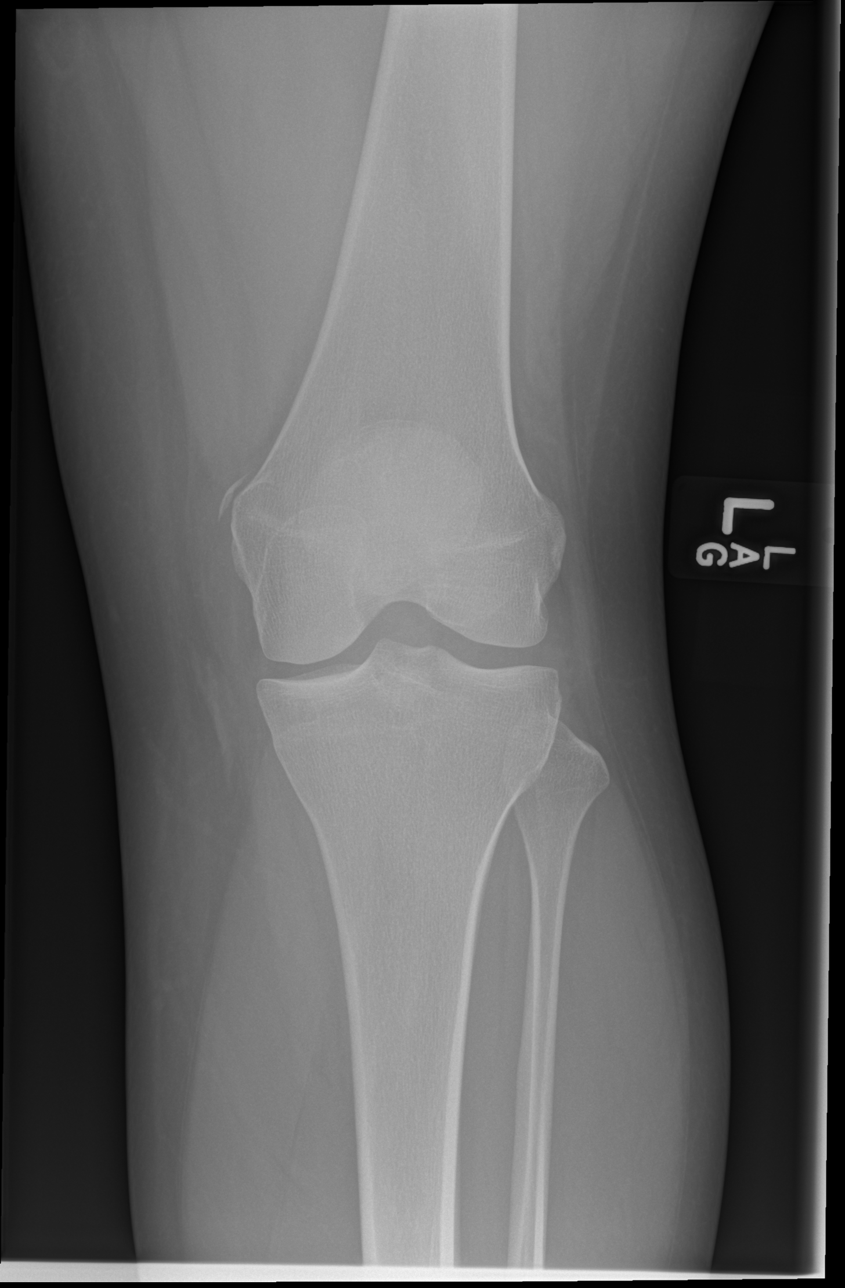

[x knee obl left (1 of 2)]
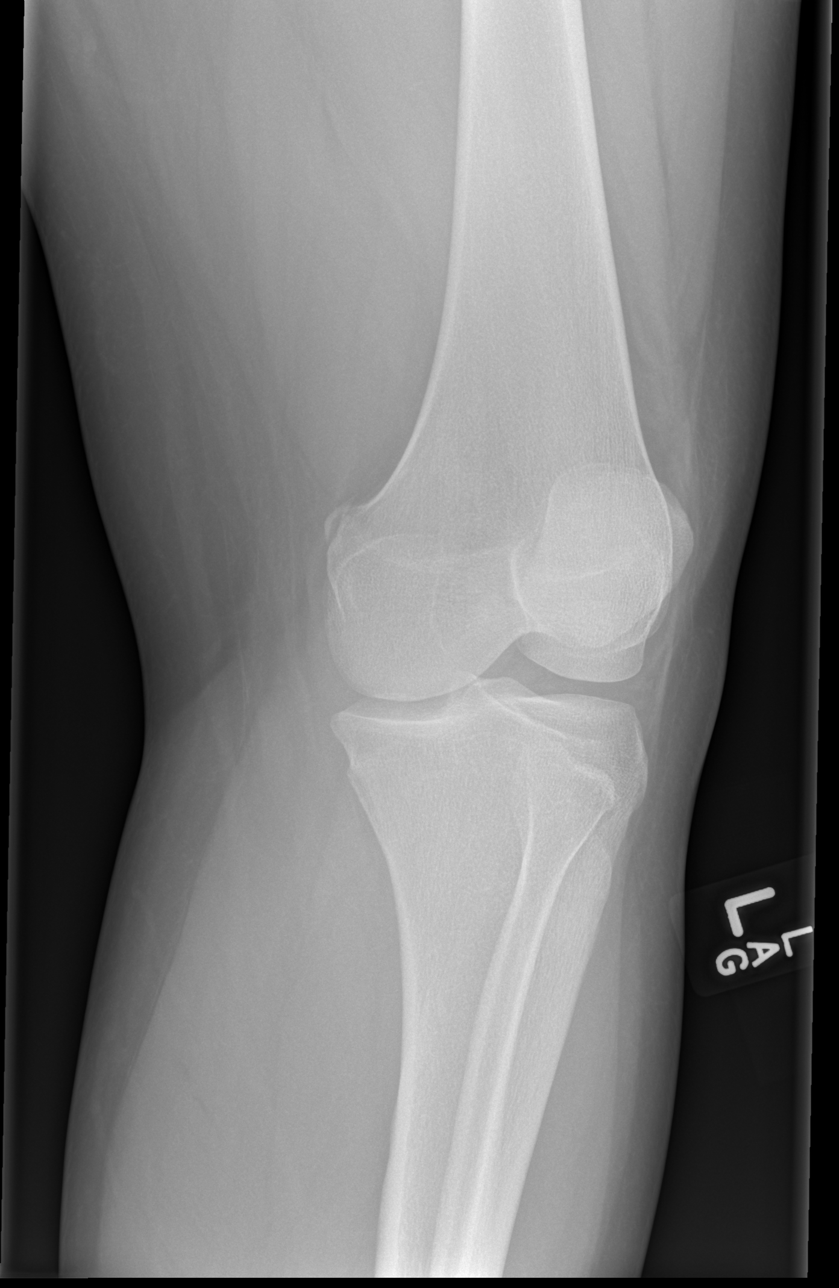

[x knee obl left (2 of 2)]
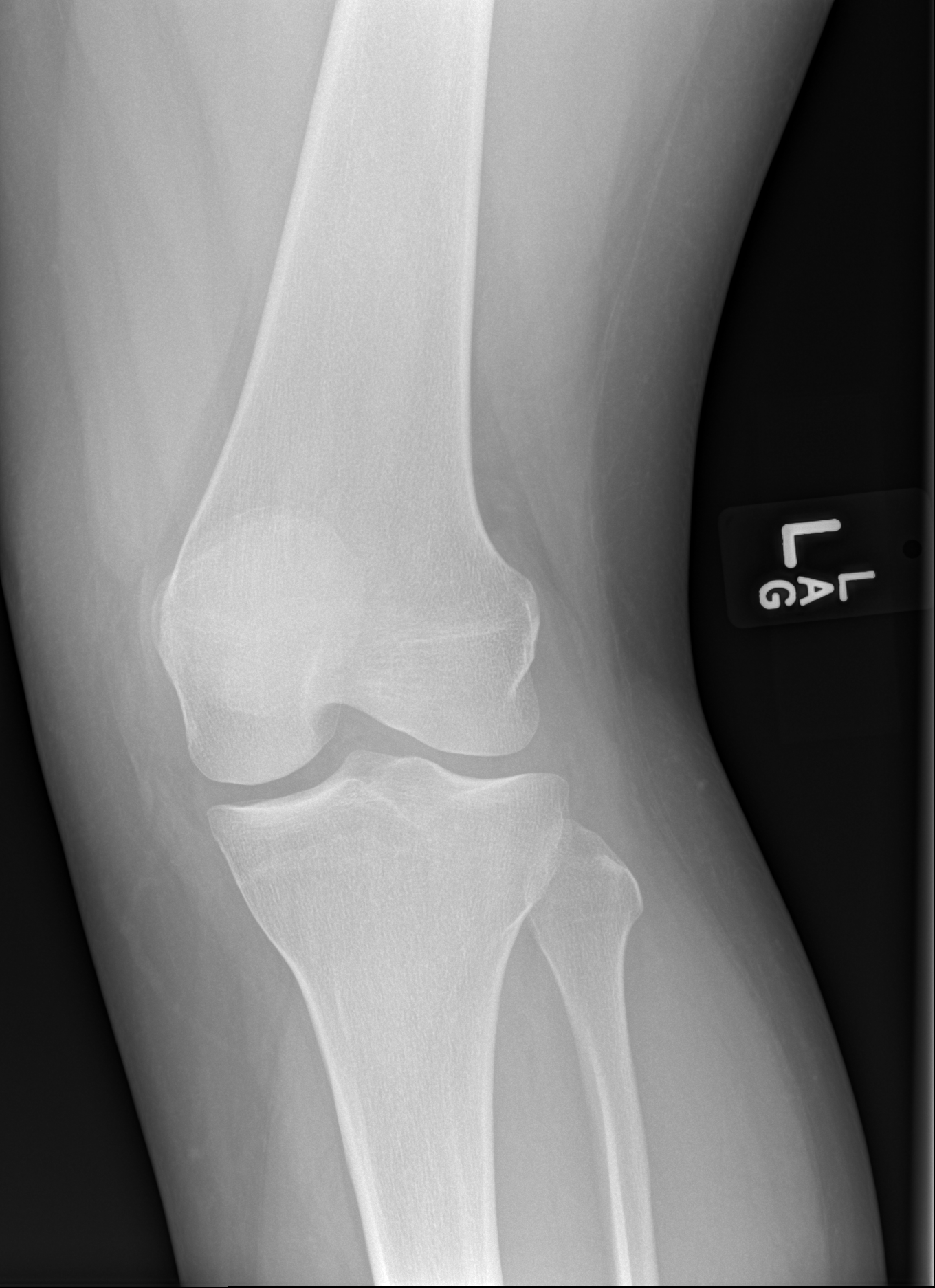

[x knee lat left]
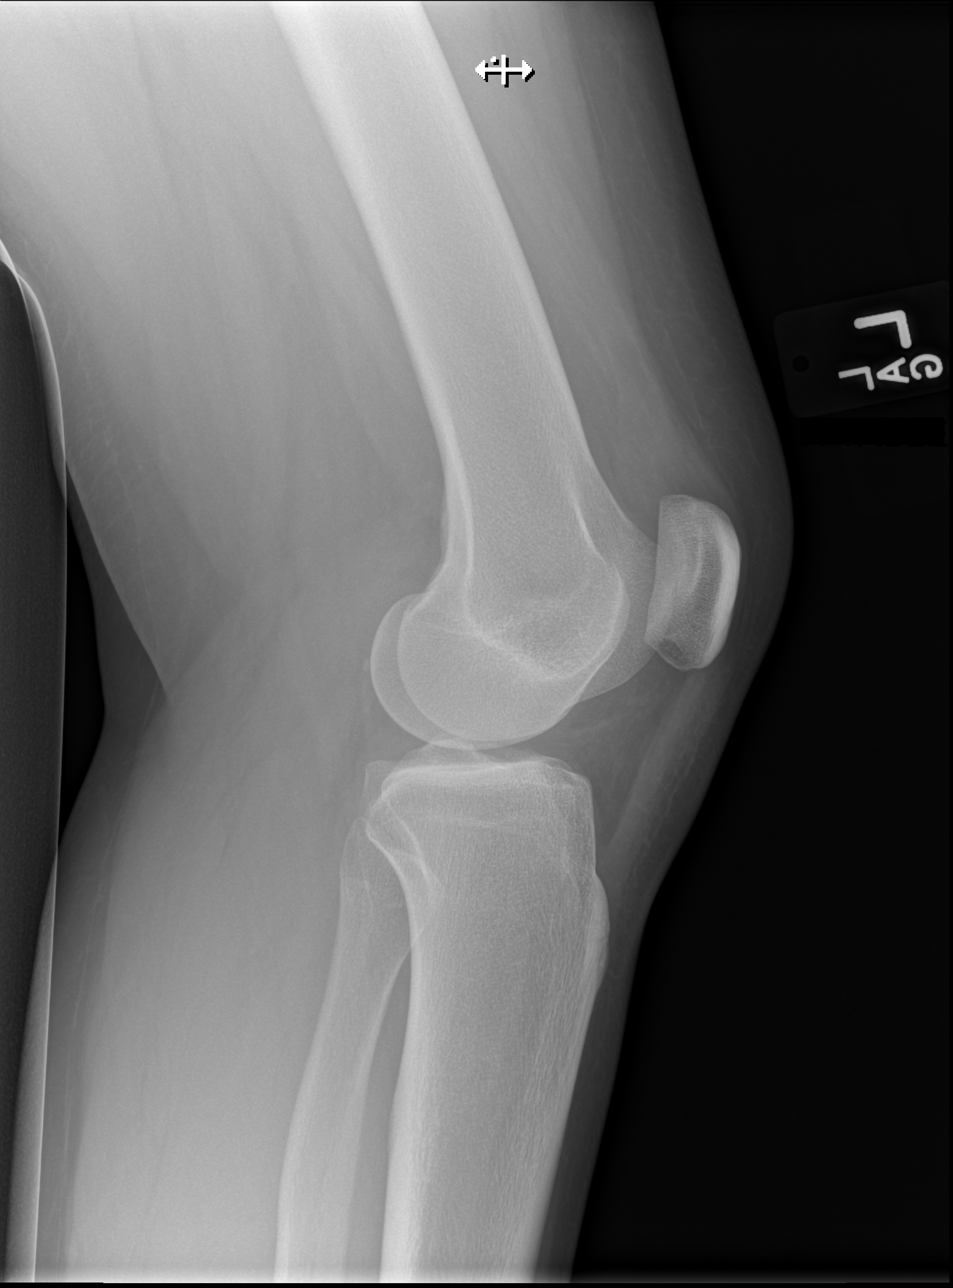

[4 of 4 positions shown; findings below may reference images not displayed]

FINDINGS: Avulsion fracture off the medial condyle along the course of the
medial collateral ligament is identified consistent with a Stieda
fracture. This is age indeterminate. The femorotibial compartment is
maintained. No tibial plateau fracture is noted. Trace joint
effusion is present. Slight soft tissue induration overlying the
patellar tendon is noted without tear or retraction. Patella is
normally situated.
IMPRESSION: 1. Age-indeterminate avulsion off the course of the medial
collateral ligament adjacent to the medial femoral condyle. Given
presenting history of infrapatellar pain, this may be subacute to
chronic in etiology.
2. Irregularity along the superficial aspect of the patellar tendon
possibly representing tendinosis or partial tear given history of
infrapatellar pain.

## 2019-01-08 DIAGNOSIS — F411 Generalized anxiety disorder: Secondary | ICD-10-CM | POA: Diagnosis not present

## 2019-01-08 DIAGNOSIS — F332 Major depressive disorder, recurrent severe without psychotic features: Secondary | ICD-10-CM | POA: Diagnosis not present

## 2019-01-29 DIAGNOSIS — F332 Major depressive disorder, recurrent severe without psychotic features: Secondary | ICD-10-CM | POA: Diagnosis not present

## 2019-01-29 DIAGNOSIS — F411 Generalized anxiety disorder: Secondary | ICD-10-CM | POA: Diagnosis not present

## 2019-03-03 DIAGNOSIS — F332 Major depressive disorder, recurrent severe without psychotic features: Secondary | ICD-10-CM | POA: Diagnosis not present

## 2019-03-03 DIAGNOSIS — F411 Generalized anxiety disorder: Secondary | ICD-10-CM | POA: Diagnosis not present

## 2019-09-30 ENCOUNTER — Ambulatory Visit: Payer: Federal, State, Local not specified - PPO

## 2020-05-08 DIAGNOSIS — J209 Acute bronchitis, unspecified: Secondary | ICD-10-CM | POA: Diagnosis not present

## 2020-05-08 DIAGNOSIS — J0100 Acute maxillary sinusitis, unspecified: Secondary | ICD-10-CM | POA: Diagnosis not present

## 2020-05-08 DIAGNOSIS — Z20828 Contact with and (suspected) exposure to other viral communicable diseases: Secondary | ICD-10-CM | POA: Diagnosis not present

## 2020-10-20 DIAGNOSIS — F32A Depression, unspecified: Secondary | ICD-10-CM | POA: Diagnosis not present

## 2020-10-20 DIAGNOSIS — F419 Anxiety disorder, unspecified: Secondary | ICD-10-CM | POA: Diagnosis not present

## 2020-10-20 DIAGNOSIS — L659 Nonscarring hair loss, unspecified: Secondary | ICD-10-CM | POA: Diagnosis not present

## 2020-10-20 DIAGNOSIS — Z1331 Encounter for screening for depression: Secondary | ICD-10-CM | POA: Diagnosis not present

## 2021-01-02 DIAGNOSIS — Z20828 Contact with and (suspected) exposure to other viral communicable diseases: Secondary | ICD-10-CM | POA: Diagnosis not present

## 2021-01-25 DIAGNOSIS — D72829 Elevated white blood cell count, unspecified: Secondary | ICD-10-CM | POA: Diagnosis not present

## 2021-01-25 DIAGNOSIS — F32A Depression, unspecified: Secondary | ICD-10-CM | POA: Diagnosis not present

## 2021-01-25 DIAGNOSIS — F419 Anxiety disorder, unspecified: Secondary | ICD-10-CM | POA: Diagnosis not present

## 2021-01-25 DIAGNOSIS — Z6836 Body mass index (BMI) 36.0-36.9, adult: Secondary | ICD-10-CM | POA: Diagnosis not present

## 2021-02-27 DIAGNOSIS — Z111 Encounter for screening for respiratory tuberculosis: Secondary | ICD-10-CM | POA: Diagnosis not present

## 2021-11-08 DIAGNOSIS — F32A Depression, unspecified: Secondary | ICD-10-CM | POA: Diagnosis not present

## 2021-11-08 DIAGNOSIS — Z3200 Encounter for pregnancy test, result unknown: Secondary | ICD-10-CM | POA: Diagnosis not present

## 2021-11-08 DIAGNOSIS — F419 Anxiety disorder, unspecified: Secondary | ICD-10-CM | POA: Diagnosis not present

## 2021-11-08 DIAGNOSIS — Z3490 Encounter for supervision of normal pregnancy, unspecified, unspecified trimester: Secondary | ICD-10-CM | POA: Diagnosis not present

## 2021-11-16 DIAGNOSIS — N911 Secondary amenorrhea: Secondary | ICD-10-CM | POA: Diagnosis not present

## 2021-11-16 DIAGNOSIS — Z3201 Encounter for pregnancy test, result positive: Secondary | ICD-10-CM | POA: Diagnosis not present

## 2021-11-16 DIAGNOSIS — Z23 Encounter for immunization: Secondary | ICD-10-CM | POA: Diagnosis not present

## 2021-12-06 DIAGNOSIS — Z3A10 10 weeks gestation of pregnancy: Secondary | ICD-10-CM | POA: Diagnosis not present

## 2021-12-06 DIAGNOSIS — Z3689 Encounter for other specified antenatal screening: Secondary | ICD-10-CM | POA: Diagnosis not present

## 2021-12-06 DIAGNOSIS — Z113 Encounter for screening for infections with a predominantly sexual mode of transmission: Secondary | ICD-10-CM | POA: Diagnosis not present

## 2021-12-06 DIAGNOSIS — O26891 Other specified pregnancy related conditions, first trimester: Secondary | ICD-10-CM | POA: Diagnosis not present

## 2021-12-06 DIAGNOSIS — Z124 Encounter for screening for malignant neoplasm of cervix: Secondary | ICD-10-CM | POA: Diagnosis not present

## 2021-12-06 DIAGNOSIS — Z363 Encounter for antenatal screening for malformations: Secondary | ICD-10-CM | POA: Diagnosis not present

## 2021-12-06 LAB — OB RESULTS CONSOLE ABO/RH: RH Type: POSITIVE

## 2021-12-06 LAB — OB RESULTS CONSOLE ANTIBODY SCREEN: Antibody Screen: NEGATIVE

## 2021-12-06 LAB — OB RESULTS CONSOLE GC/CHLAMYDIA
Chlamydia: NEGATIVE
Neisseria Gonorrhea: NEGATIVE

## 2021-12-06 LAB — OB RESULTS CONSOLE HEPATITIS B SURFACE ANTIGEN: Hepatitis B Surface Ag: NEGATIVE

## 2021-12-06 LAB — OB RESULTS CONSOLE RUBELLA ANTIBODY, IGM: Rubella: IMMUNE

## 2021-12-31 NOTE — L&D Delivery Note (Addendum)
Delivery Note Angel Barber is a G1P0 at [redacted]w[redacted]d who had a spontaneous delivery at 2332 a viable female Angel Barber") was delivered via  OA.  APGAR: 7, 9; weight 7lb 6.2oz (3350g) .     Admitted for term IOL. Induced with cytotec, pitocin, AROM. Progressed normally. Received epidural for pain management. Pushed for 20 minutes. Baby was delivered without difficulty. No nuchal cord.  Delayed cord clamping for 60 seconds.  Delivery of placenta was spontaneous. Placenta was found to be intact, 3 -vessel cord was noted. The fundus was found to be firm. 1st degree perineal laceration was repaired in the normal sterile fashion with 3-0 vicryl. Estimated blood loss 300cc. Instrument and gauze counts were correct at the end of the procedure.  After delivery, maternal temp noted to be 101.62F. Will start amp/gent for presumed chorio.    Placenta status: pathology  Anesthesia:  epidural Episiotomy:  none Lacerations:  1st degree perineal Suture Repair: 3.0 vicryl Est. Blood Loss (mL):  300  Mom to postpartum.  Baby to Couplet care / Skin to Skin.  [name] 07/01/2022, 1:12 AM

## 2022-01-03 DIAGNOSIS — Z3A14 14 weeks gestation of pregnancy: Secondary | ICD-10-CM | POA: Diagnosis not present

## 2022-01-03 DIAGNOSIS — O99212 Obesity complicating pregnancy, second trimester: Secondary | ICD-10-CM | POA: Diagnosis not present

## 2022-01-03 DIAGNOSIS — O209 Hemorrhage in early pregnancy, unspecified: Secondary | ICD-10-CM | POA: Diagnosis not present

## 2022-01-08 DIAGNOSIS — Z3402 Encounter for supervision of normal first pregnancy, second trimester: Secondary | ICD-10-CM | POA: Diagnosis not present

## 2022-01-08 DIAGNOSIS — Z3401 Encounter for supervision of normal first pregnancy, first trimester: Secondary | ICD-10-CM | POA: Diagnosis not present

## 2022-02-12 DIAGNOSIS — O99212 Obesity complicating pregnancy, second trimester: Secondary | ICD-10-CM | POA: Diagnosis not present

## 2022-02-12 DIAGNOSIS — Z3A20 20 weeks gestation of pregnancy: Secondary | ICD-10-CM | POA: Diagnosis not present

## 2022-02-12 DIAGNOSIS — Z363 Encounter for antenatal screening for malformations: Secondary | ICD-10-CM | POA: Diagnosis not present

## 2022-04-04 DIAGNOSIS — Z3689 Encounter for other specified antenatal screening: Secondary | ICD-10-CM | POA: Diagnosis not present

## 2022-04-04 LAB — OB RESULTS CONSOLE HIV ANTIBODY (ROUTINE TESTING): HIV: NONREACTIVE

## 2022-04-04 LAB — OB RESULTS CONSOLE RPR: RPR: NONREACTIVE

## 2022-04-16 DIAGNOSIS — J069 Acute upper respiratory infection, unspecified: Secondary | ICD-10-CM | POA: Diagnosis not present

## 2022-04-23 DIAGNOSIS — O99810 Abnormal glucose complicating pregnancy: Secondary | ICD-10-CM | POA: Diagnosis not present

## 2022-06-13 LAB — OB RESULTS CONSOLE GBS: GBS: NEGATIVE

## 2022-06-29 ENCOUNTER — Other Ambulatory Visit: Payer: Self-pay

## 2022-06-29 ENCOUNTER — Inpatient Hospital Stay (HOSPITAL_COMMUNITY): Payer: Medicaid Other

## 2022-06-29 ENCOUNTER — Other Ambulatory Visit: Payer: Self-pay | Admitting: Obstetrics and Gynecology

## 2022-06-29 ENCOUNTER — Inpatient Hospital Stay (HOSPITAL_COMMUNITY)
Admission: AD | Admit: 2022-06-29 | Discharge: 2022-07-02 | DRG: 805 | Disposition: A | Discharge status: Home / Self Care | Payer: Medicaid Other | Attending: Obstetrics and Gynecology | Admitting: Obstetrics and Gynecology

## 2022-06-29 ENCOUNTER — Encounter (HOSPITAL_COMMUNITY): Payer: Self-pay | Admitting: Obstetrics and Gynecology

## 2022-06-29 DIAGNOSIS — O411230 Chorioamnionitis, third trimester, not applicable or unspecified: Secondary | ICD-10-CM | POA: Diagnosis present

## 2022-06-29 DIAGNOSIS — Z3A39 39 weeks gestation of pregnancy: Secondary | ICD-10-CM | POA: Diagnosis not present

## 2022-06-29 DIAGNOSIS — Z87891 Personal history of nicotine dependence: Secondary | ICD-10-CM

## 2022-06-29 DIAGNOSIS — O99344 Other mental disorders complicating childbirth: Secondary | ICD-10-CM | POA: Diagnosis present

## 2022-06-29 DIAGNOSIS — F419 Anxiety disorder, unspecified: Secondary | ICD-10-CM | POA: Diagnosis present

## 2022-06-29 DIAGNOSIS — O24410 Gestational diabetes mellitus in pregnancy, diet controlled: Secondary | ICD-10-CM

## 2022-06-29 DIAGNOSIS — O24429 Gestational diabetes mellitus in childbirth, unspecified control: Principal | ICD-10-CM | POA: Diagnosis present

## 2022-06-29 DIAGNOSIS — O24419 Gestational diabetes mellitus in pregnancy, unspecified control: Principal | ICD-10-CM | POA: Diagnosis present

## 2022-06-29 DIAGNOSIS — O26893 Other specified pregnancy related conditions, third trimester: Secondary | ICD-10-CM | POA: Diagnosis present

## 2022-06-29 HISTORY — DX: Anxiety disorder, unspecified: F41.9

## 2022-06-29 LAB — TYPE AND SCREEN
ABO/RH(D): O POS
Antibody Screen: NEGATIVE

## 2022-06-29 LAB — CBC
HCT: 31.8 % — ABNORMAL LOW (ref 36.0–46.0)
Hemoglobin: 10.3 g/dL — ABNORMAL LOW (ref 12.0–15.0)
MCH: 25.3 pg — ABNORMAL LOW (ref 26.0–34.0)
MCHC: 32.4 g/dL (ref 30.0–36.0)
MCV: 78.1 fL — ABNORMAL LOW (ref 80.0–100.0)
Platelets: 330 10*3/uL (ref 150–400)
RBC: 4.07 MIL/uL (ref 3.87–5.11)
RDW: 15.4 % (ref 11.5–15.5)
WBC: 12.4 10*3/uL — ABNORMAL HIGH (ref 4.0–10.5)
nRBC: 0 % (ref 0.0–0.2)

## 2022-06-29 MED ORDER — OXYTOCIN-SODIUM CHLORIDE 30-0.9 UT/500ML-% IV SOLN
1.0000 m[IU]/min | INTRAVENOUS | Status: DC
  Administered 2022-06-30: 2 m[IU]/min via INTRAVENOUS
  Filled 2022-06-29 (×2): qty 500

## 2022-06-29 MED ORDER — LIDOCAINE HCL (PF) 1 % IJ SOLN
30.0000 mL | INTRAMUSCULAR | Status: DC | PRN
  Filled 2022-06-29: qty 30

## 2022-06-29 MED ORDER — OXYCODONE-ACETAMINOPHEN 5-325 MG PO TABS: 2.0000 | ORAL_TABLET | ORAL | Status: DC | PRN

## 2022-06-29 MED ORDER — LACTATED RINGERS IV SOLN: INTRAVENOUS | Status: DC

## 2022-06-29 MED ORDER — TERBUTALINE SULFATE 1 MG/ML IJ SOLN: 0.2500 mg | Freq: Once | INTRAMUSCULAR | Status: DC | PRN

## 2022-06-29 MED ORDER — OXYTOCIN BOLUS FROM INFUSION
333.0000 mL | Freq: Once | INTRAVENOUS | Status: AC
  Administered 2022-06-30: 333 mL via INTRAVENOUS

## 2022-06-29 MED ORDER — LACTATED RINGERS IV SOLN
500.0000 mL | INTRAVENOUS | Status: DC | PRN
  Administered 2022-06-30: 1000 mL via INTRAVENOUS

## 2022-06-29 MED ORDER — OXYTOCIN-SODIUM CHLORIDE 30-0.9 UT/500ML-% IV SOLN: 2.5000 [IU]/h | INTRAVENOUS | Status: DC

## 2022-06-29 MED ORDER — MISOPROSTOL 25 MCG QUARTER TABLET
25.0000 ug | ORAL_TABLET | ORAL | Status: DC | PRN
  Administered 2022-06-29 (×2): 25 ug via VAGINAL
  Filled 2022-06-29 (×2): qty 1

## 2022-06-29 MED ORDER — ONDANSETRON HCL 4 MG/2ML IJ SOLN
4.0000 mg | Freq: Four times a day (QID) | INTRAMUSCULAR | Status: DC | PRN
  Administered 2022-06-30: 4 mg via INTRAVENOUS
  Filled 2022-06-29: qty 2

## 2022-06-29 MED ORDER — SOD CITRATE-CITRIC ACID 500-334 MG/5ML PO SOLN
30.0000 mL | ORAL | Status: DC | PRN
  Administered 2022-06-29 – 2022-07-01 (×2): 30 mL via ORAL
  Filled 2022-06-29 (×2): qty 30

## 2022-06-29 MED ORDER — OXYCODONE-ACETAMINOPHEN 5-325 MG PO TABS: 1.0000 | ORAL_TABLET | ORAL | Status: DC | PRN

## 2022-06-29 MED ORDER — ACETAMINOPHEN 325 MG PO TABS
650.0000 mg | ORAL_TABLET | ORAL | Status: DC | PRN
  Administered 2022-07-01: 650 mg via ORAL
  Filled 2022-06-29: qty 2

## 2022-06-29 NOTE — H&P (Signed)
Angel Barber is a 27 y.o. female presenting for scheduled IOL. +FM, denies VB, LOF, CTX  PNC c/b 1) BMI 41 2) Resolved LLP 3) Anxiety prev on Lexapro - stopped in January GBS neg  OB History     Gravida  1   Para      Term      Preterm      AB      Living         SAB      IAB      Ectopic      Multiple      Live Births             Past Medical History:  Diagnosis Date   Anxiety    Past Surgical History:  Procedure Laterality Date   NO PAST SURGERIES     Family History: family history includes Breast cancer in some other family members; Breast cancer (age of onset: 83) in her mother; Lung cancer in an other family member; Other in her father and another family member; Rectal cancer in an other family member; Vision loss in her father. Social History:  reports that she quit smoking about 3 months ago. Her smoking use included cigarettes. She smoked an average of .5 packs per day. She has never used smokeless tobacco. She reports current alcohol use. She reports current drug use. Frequency: 2.00 times per week. Drug: Marijuana.     Maternal Diabetes: No passed 3hr Genetic Screening: Normal Maternal Ultrasounds/Referrals: Normal Fetal Ultrasounds or other Referrals:  None Maternal Substance Abuse:  No Significant Maternal Medications:  None Significant Maternal Lab Results:  Group B Strep negative Other Comments:  None  Review of Systems  Constitutional:  Negative for chills and fever.  Respiratory:  Negative for shortness of breath.   Cardiovascular:  Negative for chest pain, palpitations and leg swelling.  Gastrointestinal:  Negative for abdominal pain, nausea and vomiting.  Neurological:  Negative for dizziness, weakness and headaches.  Psychiatric/Behavioral:  Negative for suicidal ideas.    Maternal Medical History:  Reason for admission: Nausea.    Dilation: 1 Effacement (%): Thick Station: -2 Exam by:: LIma, rn Blood pressure 134/81,  pulse 85, temperature 98 F (36.7 C), temperature source Oral, resp. rate 18, height 5\' 5"  (1.651 m), weight 112.3 kg. Exam Physical Exam Constitutional:      General: She is not in acute distress.    Appearance: She is well-developed.  HENT:     Head: Normocephalic and atraumatic.  Eyes:     Pupils: Pupils are equal, round, and reactive to light.  Cardiovascular:     Rate and Rhythm: Normal rate and regular rhythm.     Heart sounds: No murmur heard.    No gallop.  Abdominal:     Tenderness: There is no abdominal tenderness. There is no guarding or rebound.  Genitourinary:    Vagina: Normal.  Musculoskeletal:        General: Normal range of motion.     Cervical back: Normal range of motion and neck supple.  Skin:    General: Skin is warm and dry.  Neurological:     Mental Status: She is alert and oriented to person, place, and time.     Prenatal labs: ABO, Rh: --/--/O POS (06/30 1550) Antibody: NEG (06/30 1550) Rubella: Immune (12/07 0000) RPR: Nonreactive (04/05 0000)  HBsAg: Negative (12/07 0000)  HIV: Non-reactive (04/05 0000)  GBS: Negative/-- (06/14 0000)   Cat 1 tracing, irritability  on TOCO  Assessment/Plan: This is a 27yo G1 @ 39 6/7 by LMP c/w 10wk scan admitted for IOL at term. GBS neg -Cytotec for ripening followed by AROM and pitocin when amenable   Valerie Roys Nicolaas Savo 06/29/2022, 5:16 PM

## 2022-06-30 ENCOUNTER — Inpatient Hospital Stay (HOSPITAL_COMMUNITY): Payer: Medicaid Other | Admitting: Anesthesiology

## 2022-06-30 LAB — RPR: RPR Ser Ql: NONREACTIVE

## 2022-06-30 MED ORDER — EPHEDRINE 5 MG/ML INJ: 10.0000 mg | INTRAVENOUS | Status: DC | PRN

## 2022-06-30 MED ORDER — DIPHENHYDRAMINE HCL 50 MG/ML IJ SOLN: 12.5000 mg | INTRAMUSCULAR | Status: DC | PRN

## 2022-06-30 MED ORDER — LACTATED RINGERS IV SOLN
500.0000 mL | Freq: Once | INTRAVENOUS | Status: AC
Start: 2022-06-30 — End: 2022-06-30
  Administered 2022-06-30: 500 mL via INTRAVENOUS

## 2022-06-30 MED ORDER — PHENYLEPHRINE 80 MCG/ML (10ML) SYRINGE FOR IV PUSH (FOR BLOOD PRESSURE SUPPORT): 80.0000 ug | PREFILLED_SYRINGE | INTRAVENOUS | Status: DC | PRN

## 2022-06-30 MED ORDER — MISOPROSTOL 50MCG HALF TABLET
50.0000 ug | ORAL_TABLET | ORAL | Status: DC | PRN
  Administered 2022-06-30: 50 ug via ORAL
  Filled 2022-06-30: qty 1

## 2022-06-30 MED ORDER — LACTATED RINGERS AMNIOINFUSION: INTRAVENOUS | Status: DC

## 2022-06-30 MED ORDER — HYDROXYZINE HCL 25 MG PO TABS
50.0000 mg | ORAL_TABLET | Freq: Four times a day (QID) | ORAL | Status: DC | PRN
  Administered 2022-06-30: 50 mg via ORAL
  Filled 2022-06-30: qty 1

## 2022-06-30 MED ORDER — FENTANYL CITRATE (PF) 100 MCG/2ML IJ SOLN
50.0000 ug | INTRAMUSCULAR | Status: DC | PRN
  Administered 2022-06-30 (×2): 50 ug via INTRAVENOUS
  Filled 2022-06-30 (×2): qty 2

## 2022-06-30 MED ORDER — LIDOCAINE HCL (PF) 1 % IJ SOLN
INTRAMUSCULAR | Status: DC | PRN
  Administered 2022-06-30: 8 mL via EPIDURAL

## 2022-06-30 MED ORDER — PHENYLEPHRINE 80 MCG/ML (10ML) SYRINGE FOR IV PUSH (FOR BLOOD PRESSURE SUPPORT)
80.0000 ug | PREFILLED_SYRINGE | INTRAVENOUS | Status: DC | PRN
  Filled 2022-06-30: qty 10

## 2022-06-30 MED ORDER — BUTORPHANOL TARTRATE 1 MG/ML IJ SOLN
1.0000 mg | INTRAMUSCULAR | Status: DC | PRN
Start: 2022-06-30 — End: 2022-06-30

## 2022-06-30 MED ORDER — FENTANYL-BUPIVACAINE-NACL 0.5-0.125-0.9 MG/250ML-% EP SOLN
12.0000 mL/h | EPIDURAL | Status: DC | PRN
  Administered 2022-06-30: 12 mL/h via EPIDURAL
  Filled 2022-06-30: qty 250

## 2022-06-30 NOTE — Progress Notes (Signed)
OB Progress Note  S: Pt comfortable with epidural   O: Today's Vitals   06/30/22 0945 06/30/22 0950 06/30/22 0955 06/30/22 1000  BP: 132/73 131/72 139/74 128/61  Pulse: 90 91 76 71  Resp:  17  17  Temp:      TempSrc:      Weight:      Height:      PainSc:  0-No pain  0-No pain   Body mass index is 41.2 kg/m.  SVE 3/60/-2  FHR:  125bpm, mod variability, + accels, no decels Toco: ctx q 2-3 mins   A/P: 27Y G1P0 @ [redacted]w[redacted]d, term IOL Fetal wellbeing: cat I tracing IOL: s/p cytotec x 3, now on pitocin 26mu/min, plan for AROM when fetal station lower Pain control: epidural  M. Timothy Lasso, MD 06/30/22 10:09 AM

## 2022-06-30 NOTE — Progress Notes (Signed)
Late entry note due to patient care OB Progress Note  S: Patient comfortable but having some anxiety. Denies chest pain/SOB.    O: Today's Vitals   06/30/22 1500 06/30/22 1530 06/30/22 1600 06/30/22 1613  BP: 117/71 120/75    Pulse: 68 73    Resp: 15 16 16    Temp:      TempSrc:      Weight:      Height:      PainSc: Asleep Asleep  Asleep   Body mass index is 41.2 kg/m.  At 1412:  SVE 3.5/70/-1 AROM clear fluid  FHR: 130bpm, mod variability, + accels, some late decels following AROM, recovered with position change Toco: ctx q 2-4 mins   A/P:  A/P: 27Y G1P0 @ [redacted]w[redacted]d, term IOL Fetal wellbeing: cat I tracing IOL: s/p cytotec x 3, s/p AROM, on pitocin (currently 73mu/min) Pain control: epidural 4.  Anxiety: Atarax Po x 1    M. 13m, MD 06/30/22 4:19 PM

## 2022-06-30 NOTE — Anesthesia Procedure Notes (Signed)
Epidural Patient location during procedure: OB  Staffing Anesthesiologist: Mellody Dance, MD Performed: anesthesiologist   Preanesthetic Checklist Completed: patient identified, IV checked, site marked, risks and benefits discussed, monitors and equipment checked, pre-op evaluation and timeout performed  Epidural Patient position: sitting Prep: DuraPrep Patient monitoring: heart rate, cardiac monitor, continuous pulse ox and blood pressure Approach: midline Location: L2-L3 Injection technique: LOR saline  Needle:  Needle type: Tuohy  Needle gauge: 17 G Needle length: 9 cm Needle insertion depth: 7 cm Catheter type: closed end flexible Catheter size: 20 Guage Catheter at skin depth: 12 cm Test dose: negative and Other  Assessment Events: blood not aspirated, injection not painful, no injection resistance and negative IV test  Additional Notes Informed consent obtained prior to proceeding including risk of failure, 1% risk of PDPH, risk of minor discomfort and bruising.  Discussed rare but serious complications including epidural abscess, permanent nerve injury, epidural hematoma.  Discussed alternatives to epidural analgesia and patient desires to proceed.  Timeout performed pre-procedure verifying patient name, procedure, and platelet count.  Patient tolerated procedure well.

## 2022-06-30 NOTE — Anesthesia Preprocedure Evaluation (Signed)
Anesthesia Evaluation  Patient identified by MRN, date of birth, ID band Patient awake    Reviewed: Allergy & Precautions, NPO status , Patient's Chart, lab work & pertinent test results  Airway Mallampati: III  TM Distance: >3 FB Neck ROM: Full    Dental no notable dental hx.    Pulmonary neg pulmonary ROS, former smoker,    breath sounds clear to auscultation       Cardiovascular negative cardio ROS   Rhythm:Regular Rate:Normal     Neuro/Psych PSYCHIATRIC DISORDERS Anxiety negative neurological ROS     GI/Hepatic negative GI ROS, (+)     substance abuse  marijuana use,   Endo/Other  diabetes  Renal/GU negative Renal ROS  negative genitourinary   Musculoskeletal negative musculoskeletal ROS (+)   Abdominal   Peds negative pediatric ROS (+)  Hematology negative hematology ROS (+)   Anesthesia Other Findings   Reproductive/Obstetrics negative OB ROS                             Anesthesia Physical Anesthesia Plan  ASA: 2  Anesthesia Plan: Epidural   Post-op Pain Management:    Induction: Intravenous  PONV Risk Score and Plan: 2 and Treatment may vary due to age or medical condition, Ondansetron and Dexamethasone  Airway Management Planned:   Additional Equipment: None  Intra-op Plan:   Post-operative Plan: Extubation in OR  Informed Consent: I have reviewed the patients History and Physical, chart, labs and discussed the procedure including the risks, benefits and alternatives for the proposed anesthesia with the patient or authorized representative who has indicated his/her understanding and acceptance.     Dental advisory given  Plan Discussed with: CRNA  Anesthesia Plan Comments:         Anesthesia Quick Evaluation

## 2022-06-30 NOTE — Progress Notes (Signed)
CE still 1/thick but well-applied now. Patient declining Foley at this time, will opt for cytotec buccally. Aware that, if no significant change, will need different ripening agent BP 135/67   Pulse 77   Temp 98.1 F (36.7 C) (Oral)   Resp 16   Ht 5\' 5"  (1.651 m)   Wt 112.3 kg   BMI 41.20 kg/m

## 2022-07-01 ENCOUNTER — Encounter (HOSPITAL_COMMUNITY): Payer: Self-pay | Admitting: Obstetrics and Gynecology

## 2022-07-01 MED ORDER — GENTAMICIN SULFATE 40 MG/ML IJ SOLN
5.0000 mg/kg | Freq: Once | INTRAVENOUS | Status: DC
  Filled 2022-07-01: qty 10

## 2022-07-01 MED ORDER — BISACODYL 10 MG RE SUPP: 10.0000 mg | Freq: Every day | RECTAL | Status: DC | PRN

## 2022-07-01 MED ORDER — IBUPROFEN 600 MG PO TABS
600.0000 mg | ORAL_TABLET | Freq: Four times a day (QID) | ORAL | Status: DC
  Administered 2022-07-01 – 2022-07-02 (×6): 600 mg via ORAL
  Filled 2022-07-01 (×6): qty 1

## 2022-07-01 MED ORDER — FLEET ENEMA 7-19 GM/118ML RE ENEM: 1.0000 | ENEMA | Freq: Every day | RECTAL | Status: DC | PRN

## 2022-07-01 MED ORDER — SIMETHICONE 80 MG PO CHEW: 80.0000 mg | CHEWABLE_TABLET | ORAL | Status: DC | PRN

## 2022-07-01 MED ORDER — OXYCODONE HCL 5 MG PO TABS: 10.0000 mg | ORAL_TABLET | ORAL | Status: DC | PRN

## 2022-07-01 MED ORDER — COCONUT OIL OIL: 1.0000 | TOPICAL_OIL | Status: DC | PRN

## 2022-07-01 MED ORDER — ONDANSETRON HCL 4 MG PO TABS: 4.0000 mg | ORAL_TABLET | ORAL | Status: DC | PRN

## 2022-07-01 MED ORDER — PRENATAL MULTIVITAMIN CH
1.0000 | ORAL_TABLET | Freq: Every day | ORAL | Status: DC
  Administered 2022-07-01 – 2022-07-02 (×2): 1 via ORAL
  Filled 2022-07-01 (×2): qty 1

## 2022-07-01 MED ORDER — DOCUSATE SODIUM 100 MG PO CAPS
100.0000 mg | ORAL_CAPSULE | Freq: Two times a day (BID) | ORAL | Status: DC
  Administered 2022-07-02: 100 mg via ORAL
  Filled 2022-07-01: qty 1

## 2022-07-01 MED ORDER — WITCH HAZEL-GLYCERIN EX PADS: 1.0000 | MEDICATED_PAD | CUTANEOUS | Status: DC | PRN

## 2022-07-01 MED ORDER — DIBUCAINE (PERIANAL) 1 % EX OINT: 1.0000 | TOPICAL_OINTMENT | CUTANEOUS | Status: DC | PRN

## 2022-07-01 MED ORDER — FAMOTIDINE 20 MG PO TABS
20.0000 mg | ORAL_TABLET | Freq: Two times a day (BID) | ORAL | Status: DC
  Administered 2022-07-01 – 2022-07-02 (×2): 20 mg via ORAL
  Filled 2022-07-01 (×2): qty 1

## 2022-07-01 MED ORDER — TETANUS-DIPHTH-ACELL PERTUSSIS 5-2.5-18.5 LF-MCG/0.5 IM SUSY: 0.5000 mL | PREFILLED_SYRINGE | Freq: Once | INTRAMUSCULAR | Status: DC

## 2022-07-01 MED ORDER — BENZOCAINE-MENTHOL 20-0.5 % EX AERO
1.0000 | INHALATION_SPRAY | CUTANEOUS | Status: DC | PRN
  Filled 2022-07-01: qty 56

## 2022-07-01 MED ORDER — OXYCODONE HCL 5 MG PO TABS
5.0000 mg | ORAL_TABLET | ORAL | Status: DC | PRN
  Filled 2022-07-01: qty 1

## 2022-07-01 MED ORDER — ONDANSETRON HCL 4 MG/2ML IJ SOLN: 4.0000 mg | INTRAMUSCULAR | Status: DC | PRN

## 2022-07-01 MED ORDER — ACETAMINOPHEN 325 MG PO TABS
650.0000 mg | ORAL_TABLET | ORAL | Status: DC | PRN
  Administered 2022-07-01: 650 mg via ORAL
  Filled 2022-07-01: qty 2

## 2022-07-01 MED ORDER — DIPHENHYDRAMINE HCL 25 MG PO CAPS: 25.0000 mg | ORAL_CAPSULE | Freq: Four times a day (QID) | ORAL | Status: DC | PRN

## 2022-07-01 MED ORDER — AMPICILLIN SODIUM 2 G IJ SOLR
2.0000 g | Freq: Four times a day (QID) | INTRAMUSCULAR | Status: AC
Start: 2022-07-01 — End: 2022-07-01
  Administered 2022-07-01 (×2): 2 g via INTRAVENOUS
  Filled 2022-07-01 (×2): qty 2000

## 2022-07-01 NOTE — Anesthesia Postprocedure Evaluation (Signed)
Anesthesia Post Note  Patient: Angel Barber  Procedure(s) Performed: AN AD HOC LABOR EPIDURAL     Patient location during evaluation: Mother Baby Anesthesia Type: Epidural Level of consciousness: awake Pain management: satisfactory to patient Vital Signs Assessment: post-procedure vital signs reviewed and stable Respiratory status: spontaneous breathing Cardiovascular status: stable Anesthetic complications: no   No notable events documented.  Last Vitals:  Vitals:   07/01/22 0240 07/01/22 0640  BP: 123/71 122/71  Pulse: 85 82  Resp: 17 16  Temp: 37.3 C 37.2 C  SpO2: 99% 99%    Last Pain:  Vitals:   07/01/22 0640  TempSrc: Oral  PainSc:    Pain Goal:                   Cephus Shelling

## 2022-07-01 NOTE — Progress Notes (Addendum)
Was informed by night shift nurse that the patient refused her labs (blood draw) this morning when the phlebotomist came to do the labs. At 1000 I asked the patient if she wanted to have her labs done sometime today. Patient stated that she did not want the lab work done. I asked the patient again at 1522 if she wanted the labs (blood work) done after receiving a call from the lab. Patient declined the blood work again. I was asked by the phlebotomist (lab) if the orders could be canceled. Provider was informed that the patient refused the labs three times. Provider was noticed concerning the request of the phlebotomist (lab) and the orders were canceled.

## 2022-07-01 NOTE — Progress Notes (Addendum)
Post Partum Day 1 Subjective: Angel Barber is doing well this morning. Ambulating, voiding, tolerating PO. Minimal lochia. Sore perineum but pain is controlled. Afebrile.   Objective: Patient Vitals for the past 24 hrs:  BP Temp Temp src Pulse Resp SpO2  07/01/22 0640 122/71 98.9 F (37.2 C) Oral 82 16 99 %  07/01/22 0240 123/71 99.1 F (37.3 C) Oral 85 17 99 %  07/01/22 0148 132/79 99.4 F (37.4 C) Oral 88 18 99 %  07/01/22 0046 130/65 (!) 101 F (38.3 C) Axillary 83 -- --  07/01/22 0031 (!) 126/99 -- -- 94 -- --  07/01/22 0016 130/66 -- -- 91 -- --  07/01/22 0001 (!) 122/95 (!) 100.7 F (38.2 C) Axillary -- -- --  06/30/22 2346 (!) 110/94 -- -- -- -- --  06/30/22 2200 125/66 100.2 F (37.9 C) Axillary 83 -- --  06/30/22 2131 132/72 -- -- 90 18 --  06/30/22 2101 131/74 -- -- 85 -- --  06/30/22 2031 (!) 118/94 -- -- 90 18 --  06/30/22 2001 117/70 97.9 F (36.6 C) Oral 86 18 --  06/30/22 1931 (!) 122/94 -- -- 79 20 --  06/30/22 1830 110/64 -- -- 91 16 --  06/30/22 1800 122/71 -- -- (!) 102 16 --  06/30/22 1730 128/74 -- -- 89 16 --  06/30/22 1700 126/73 -- -- 80 -- --  06/30/22 1654 -- 98.2 F (36.8 C) Oral -- 16 --  06/30/22 1631 123/76 -- -- 78 16 --  06/30/22 1601 118/79 -- -- 78 -- --  06/30/22 1600 -- -- -- -- 16 --  06/30/22 1530 120/75 -- -- 73 16 --  06/30/22 1500 117/71 -- -- 68 15 --  06/30/22 1430 103/62 -- -- 73 16 --  06/30/22 1330 120/71 -- -- 98 17 --  06/30/22 1300 130/75 -- -- 83 -- --  06/30/22 1235 128/79 98.3 F (36.8 C) Oral 82 16 --  06/30/22 1200 126/72 -- -- 63 17 --  06/30/22 1130 128/87 -- -- 65 17 --  06/30/22 1100 (!) 110/57 -- -- 64 17 --  06/30/22 1030 124/67 -- -- 65 16 --    Physical Exam:  General: alert, cooperative, and no distress Lochia: appropriate Uterine Fundus: firm DVT Evaluation: No evidence of DVT seen on physical exam.  Recent Labs    06/29/22 1551  WBC 12.4*  HGB 10.3*  HCT 31.8*  PLT 330    No results for  input(s): "NA", "K", "CL", "CO2CT", "BUN", "CREATININE", "GLUCOSE", "BILITOT", "ALT", "AST", "ALKPHOS", "PROT", "ALBUMIN" in the last 72 hours.  No results for input(s): "CALCIUM", "MG", "PHOS" in the last 72 hours.  No results for input(s): "PROTIME", "APTT", "INR" in the last 72 hours.  No results for input(s): "PROTIME", "APTT", "INR", "FIBRINOGEN" in the last 72 hours. Assessment/Plan:  Angel Barber 27 y.o. G1P1001 PPD#1 sp SVD 1. PPC: routine PP Care 2. Chorioamnionitis: s/p amp x 2 doses and gent x 1 dose. Afebrile.  3. Rh pos 4. Desires circumcision. Per nursery, plan tomorrow given late delivery and presumed chorio.  5. Dispo: anticipate d/c home tomorrow   LOS: 2 days   [name] 07/01/2022, 10:03 AM

## 2022-07-01 NOTE — Clinical Social Work Maternal (Signed)
CLINICAL SOCIAL WORK MATERNAL/CHILD NOTE  Patient Details  Name: KHELANI KOPS MRN: [id number] Date of Birth: 21-May-1995  Date:  07/01/2022  Clinical Social Worker Initiating Note:  Idamae Lusher Date/Time: Initiated:  07/01/22/1530     Child's Name:  Noreene Larsson   Biological Parents:  Mother, Father (FOB Trinna Balloon)   Need for Interpreter:  None   Reason for Referral:  Behavioral Health Concerns, Current Substance Use/Substance Use During Pregnancy     Address:  [place] [address]    Phone number:  [phone] (home)     Additional phone number:   Household Members/Support Persons (HM/SP):   Household Member/Support Person 1, Household Member/Support Person 2   HM/SP Name Relationship DOB or Age  HM/SP -1 Damien Chavis FOB    HM/SP -2   FOB's daughter 3  HM/SP -3        HM/SP -4        HM/SP -5        HM/SP -6        HM/SP -7        HM/SP -8          Natural Supports (not living in the home):  Parent   Professional Supports: None   Employment: Part-time   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   Education:  [employer] school graduate   Homebound arranged:    Museum/gallery curator Resources:  Medicaid   Other Resources:      Cultural/Religious Considerations Which May Impact Care:  None identified  Strengths:  Ability to meet basic needs  , Home prepared for child  , Pediatrician chosen   Psychotropic Medications:         Pediatrician:    [place]  Pediatrician List:   [facility]  [place]      Pediatrician Fax Number:    Risk Factors/Current Problems:  Substance Use  , Mental Health Concerns     Cognitive State:  Linear Thinking  , Able to Concentrate  , Goal Oriented  , Alert     Mood/Affect:  Relaxed  , Interested  , Comfortable  , Calm     CSW Assessment: CSW received consult for substance use during pregnany and a  history of depression/anxiety. CSW met with MOB to provide resources and complete assessment. When CSW entered room, MOB was sitting in hospital bed, FOB was sitting on couch bonding with infant skin-to-skin. CSW introduced self and asked to speak with MOB alone. FOB initially appeared resistant but agreed to leave the room. CSW introduced self and explained reason for consult. MOB presented as calm and reserved. MOB remained engaged throughout the assessment.   CSW inquired about MOB's mental health history. MOB reports she has experienced anxiety prior to pregnancy but reports symptoms of anxiety have resolved since she became pregnant with the exception of feeling anxious yesterday. MOB reports she was unable to identify what specifically caused her to feel anxious but reports she felt like she had "a weight on (her) chest." MOB reports when she experiences anxiety, she endorses physical symptoms. CSW inquired if MOB has a history of panic attacks, MOB shared that she has had panic attacks rarely in the past. MOB reports she was previously on Lexapro for anxiety but discontinued the medication prior to pregnancy, reporting that her anxiety symptoms resolved on their own. MOB reports  she has attended therapy in the past but is not currently in therapy. MOB reports she receives support from her mother. MOB denied current SI/HI/DV. MOB's scored a 2 on Edinburgh Postnatal Depression Screen. MOB declined mental health resources at this time. CSW inquired how MOB has felt emotionally since giving birth, MOB reports she has felt "really good." MOB identified taking psychotropic medication as her coping skill to manage mental health symptoms.  CSW informed MOB about hospital drug screen policy due to documented use of MOB using marijuana during pregnancy. CSW explained that infant's UDS and CDS would be monitored and a CPS report would be made if warranted. CSW inquired about substance use during pregnancy. MOB  reports she drank wine during pregnancy and smoked marijuana. MOB reports she smoked marijuana "a few times when sick." MOB reports her last use was 1 month ago.  MOB reports she has all necessary items for baby, including a car seat and bassinet. MOB declined additional resources at this time.  CSW provided education regarding the baby blues period vs. perinatal mood disorders, discussed treatment and gave resources for mental health follow up if concerns arise.  CSW recommends self-evaluation during the postpartum time period using the New Mom Checklist from Postpartum Progress and encouraged MOB to contact a medical professional if symptoms are noted at any time.    CSW provided review of Sudden Infant Death Syndrome (SIDS) precautions.    CSW identifies no further need for intervention and no barriers to discharge at this time.   CSW Plan/Description:  No Further Intervention Required/No Barriers to Discharge, Sudden Infant Death Syndrome (SIDS) Education, Perinatal Mood and Anxiety Disorder (PMADs) Education, [place], CSW Will Continue to Monitor Umbilical Cord Tissue Drug Screen Results and Make Report if Herma Carson, [place] 07/01/2022, 3:37 PM

## 2022-07-01 NOTE — Progress Notes (Signed)
Patient refuses her labs to be drawn.

## 2022-07-02 MED ORDER — IBUPROFEN 600 MG PO TABS: 600.0000 mg | ORAL_TABLET | Freq: Four times a day (QID) | ORAL | 0 refills | Status: AC

## 2022-07-02 NOTE — Discharge Summary (Signed)
Postpartum Discharge Summary  Date of Service updated 07/02/22     Patient Name: Angel Barber DOB: [birth year] MRN: [id number]  Date of admission: 06/29/2022 Delivery date:06/30/2022  Delivering provider: Derl Barrow E  Date of discharge: 07/02/2022  Admitting diagnosis: Gestational diabetes [O24.419] Intrauterine pregnancy: [redacted]w[redacted]d     Secondary diagnosis:  Principal Problem:   Gestational diabetes  Additional problems: Chorioamnionitis diagnosed via fever after delivery   Discharge diagnosis: Term Pregnancy Delivered                                              Post partum procedures: n/a Augmentation: AROM, Pitocin, and Cytotec Complications: Intrauterine Inflammation or infection (Chorioamniotis)  Hospital course: Induction of Labor With Vaginal Delivery   27 y.o. yo G1P1001 at [redacted]w[redacted]d was admitted to the hospital 06/29/2022 for induction of labor.  Indication for induction: Elective.  Patient had an uncomplicated labor course as follows: Membrane Rupture Time/Date: 2:12 PM ,06/30/2022   Delivery Method:Vaginal, Spontaneous  Episiotomy: None  Lacerations:  1st degree  Details of delivery can be found in separate delivery note.  She had a temperature following delivery so received  ampicillin / gentamicin.  She then had a routine postpartum course. Patient is discharged home 07/02/22.  Newborn Data: Birth date:06/30/2022  Birth time:11:32 PM  Gender:Female  Living status:Living  Apgars:7 ,9  Weight:3350 g     Physical exam  Vitals:   07/01/22 1005 07/01/22 1440 07/01/22 2047 07/02/22 0555  BP: 114/76 130/73 109/79 (!) 112/53  Pulse: 77 74 79 68  Resp: 16 16 18 18   Temp: 98.7 F (37.1 C) 98.5 F (36.9 C) 99.1 F (37.3 C) 97.7 F (36.5 C)  TempSrc: Oral Oral Oral Oral  SpO2: 100% 99%    Weight:      Height:       General: alert, cooperative, and no distress Lochia: appropriate Uterine Fundus: firm Incision: N/A DVT Evaluation: No evidence of DVT seen on  physical exam. Labs: Lab Results  Component Value Date   WBC 12.4 (H) 06/29/2022   HGB 10.3 (L) 06/29/2022   HCT 31.8 (L) 06/29/2022   MCV 78.1 (L) 06/29/2022   PLT 330 06/29/2022      Latest Ref Rng & Units 03/31/2018    4:19 AM  CMP  Glucose 65 - 99 mg/dL 05/31/2018   BUN 6 - 20 mg/dL 11   Creatinine 355 - 1.00 mg/dL 7.32   Sodium 2.02 - 542 mmol/L 136   Potassium 3.5 - 5.1 mmol/L 4.1   Chloride 101 - 111 mmol/L 101   CO2 22 - 32 mmol/L 22   Calcium 8.9 - 10.3 mg/dL 9.0   Total Protein 6.5 - 8.1 g/dL 7.0   Total Bilirubin 0.3 - 1.2 mg/dL 0.6   Alkaline Phos 38 - 126 U/L 75   AST 15 - 41 U/L 28   ALT 14 - 54 U/L 28    Edinburgh Score:    07/01/2022   10:05 AM  Edinburgh Postnatal Depression Scale Screening Tool  I have been able to laugh and see the funny side of things. 0  I have looked forward with enjoyment to things. 0  I have blamed myself unnecessarily when things went wrong. 0  I have been anxious or worried for no good reason. 1  I have felt scared or panicky for no  good reason. 0  Things have been getting on top of me. 1  I have been so unhappy that I have had difficulty sleeping. 0  I have felt sad or miserable. 0  I have been so unhappy that I have been crying. 0  The thought of harming myself has occurred to me. 0  Edinburgh Postnatal Depression Scale Total 2      After visit meds:  Allergies as of 07/02/2022   No Known Allergies      Medication List     STOP taking these medications    dicyclomine 20 MG tablet Commonly known as: Bentyl   HYDROcodone-acetaminophen 5-325 MG tablet Commonly known as: NORCO/VICODIN   omeprazole 10 MG capsule Commonly known as: PriLOSEC   ondansetron 4 MG disintegrating tablet Commonly known as: ZOFRAN-ODT       TAKE these medications    ibuprofen 600 MG tablet Commonly known as: ADVIL Take 1 tablet (600 mg total) by mouth every 6 (six) hours.         Discharge home in stable condition Infant Feeding:  Bottle Infant Disposition:home with mother Discharge instruction: per After Visit Summary and Postpartum booklet. Activity: Advance as tolerated. Pelvic rest for 6 weeks.  Diet: routine diet Anticipated Birth Control: Unsure Postpartum Appointment:6 weeks Additional Postpartum F/U:  n/a Future Appointments:No future appointments. Follow up Visit:  Follow-up Information     [facility] Follow up in 6 week(s).   Contact information: [address]  [address] [place] [address] [phone]                     07/02/2022 [facility] GEFFEL Chestine Spore, MD

## 2022-07-02 NOTE — Progress Notes (Signed)
Post Partum Day 2 Subjective: Angel Barber is doing well this morning. Ambulating, voiding, tolerating PO. Minimal lochia. Sore perineum but pain is controlled. Afebrile.   Objective: Patient Vitals for the past 24 hrs:  BP Temp Temp src Pulse Resp SpO2  07/02/22 0555 (!) 112/53 97.7 F (36.5 C) Oral 68 18 --  07/01/22 2047 109/79 99.1 F (37.3 C) Oral 79 18 --  07/01/22 1440 130/73 98.5 F (36.9 C) Oral 74 16 99 %  07/01/22 1005 114/76 98.7 F (37.1 C) Oral 77 16 100 %    Physical Exam:  General: alert, cooperative, and no distress Lochia: appropriate Uterine Fundus: firm DVT Evaluation: No evidence of DVT seen on physical exam.  Recent Labs    06/29/22 1551  WBC 12.4*  HGB 10.3*  HCT 31.8*  PLT 330    Angel Barber 27 y.o. G1P1001 PPD#2 sp SVD 1. PPC: routine PP Care 2. Chorioamnionitis: s/p amp x 2 doses and gent x 1 dose. Afebrile.  3. Desires circumcision.   Discussed r/b/a of the procedure.  Reviewed that circumcision is an elective surgical procedure and not considered medically necessary.  Reviewed the risks of the procedure including the risk of infection, bleeding, damage to surrounding structures, including scrotum, shaft, urethra and head of penis, and an undesired cosmetic effect requiring additional procedures for revision.  Consent signed.   Meeting all goals.  Discharge to home today.     LOS: 3 days   [facility] GEFFEL Angel Barber 07/02/2022, 9:13 AM

## 2022-07-10 ENCOUNTER — Telehealth (HOSPITAL_COMMUNITY): Payer: Self-pay | Admitting: *Deleted

## 2022-07-10 LAB — SURGICAL PATHOLOGY

## 2022-07-10 NOTE — Telephone Encounter (Signed)
Left phone voicemail message.  Duffy Rhody, RN 07-10-2022 at 1:50pm

## 2022-09-27 ENCOUNTER — Other Ambulatory Visit: Payer: Self-pay

## 2022-09-27 ENCOUNTER — Emergency Department
Admission: EM | Admit: 2022-09-27 | Discharge: 2022-09-27 | Disposition: A | Discharge status: Home / Self Care | Payer: Medicaid Other | Attending: Emergency Medicine | Admitting: Emergency Medicine

## 2022-09-27 ENCOUNTER — Emergency Department: Payer: Medicaid Other

## 2022-09-27 DIAGNOSIS — M79661 Pain in right lower leg: Secondary | ICD-10-CM | POA: Insufficient documentation

## 2022-09-27 DIAGNOSIS — S0003XA Contusion of scalp, initial encounter: Secondary | ICD-10-CM | POA: Diagnosis not present

## 2022-09-27 DIAGNOSIS — M25562 Pain in left knee: Secondary | ICD-10-CM | POA: Diagnosis not present

## 2022-09-27 DIAGNOSIS — Y92410 Unspecified street and highway as the place of occurrence of the external cause: Secondary | ICD-10-CM | POA: Diagnosis not present

## 2022-09-27 DIAGNOSIS — S0990XA Unspecified injury of head, initial encounter: Secondary | ICD-10-CM | POA: Diagnosis present

## 2022-09-27 DIAGNOSIS — T07XXXA Unspecified multiple injuries, initial encounter: Secondary | ICD-10-CM

## 2022-09-27 MED ORDER — ACETAMINOPHEN 500 MG PO TABS
1000.0000 mg | ORAL_TABLET | Freq: Once | ORAL | Status: AC
  Administered 2022-09-27: 1000 mg via ORAL
  Filled 2022-09-27: qty 2
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
## 2022-09-27 NOTE — Discharge Instructions (Signed)
Follow-up with your primary care or  urgent care if any continued problems or concerns.  Ice to your muscles and also to your scalp contusion.  Tylenol or ibuprofen as needed for soreness and stiffness.  You can expect to be sore for approximately 4 to 5 days.  Try to move frequently to avoid stiffness.  Increase fluids.
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
## 2023-05-15 ENCOUNTER — Emergency Department
Admission: EM | Admit: 2023-05-15 | Discharge: 2023-05-15 | Disposition: A | Discharge status: Home / Self Care | Payer: Medicaid Other | Attending: Emergency Medicine | Admitting: Emergency Medicine

## 2023-05-15 ENCOUNTER — Other Ambulatory Visit: Payer: Self-pay

## 2023-05-15 ENCOUNTER — Encounter: Payer: Self-pay | Admitting: Emergency Medicine

## 2023-05-15 DIAGNOSIS — J358 Other chronic diseases of tonsils and adenoids: Secondary | ICD-10-CM | POA: Diagnosis not present

## 2023-05-15 DIAGNOSIS — J029 Acute pharyngitis, unspecified: Secondary | ICD-10-CM | POA: Diagnosis present

## 2023-05-15 LAB — GROUP A STREP BY PCR: Group A Strep by PCR: NOT DETECTED

## 2023-05-15 MED ORDER — MAGIC MOUTHWASH W/LIDOCAINE: 5.0000 mL | Freq: Four times a day (QID) | ORAL | 0 refills | Status: AC

## 2023-05-15 MED ORDER — CEPHALEXIN 500 MG PO CAPS: 500.0000 mg | ORAL_CAPSULE | Freq: Four times a day (QID) | ORAL | 0 refills | Status: AC

## 2023-05-15 MED ORDER — LIDOCAINE VISCOUS HCL 2 % MT SOLN
15.0000 mL | Freq: Once | OROMUCOSAL | Status: AC
  Administered 2023-05-15: 15 mL via OROMUCOSAL
  Filled 2023-05-15: qty 15

## 2023-05-15 NOTE — ED Triage Notes (Signed)
Pt via POV from home. States she has white spots on the back of her throat past couple of days. States that she went to UC 2 days ago and they told her they were tonsil stones. States now she is having pain and it started on the R side and now travelled to the L side. Pt is A&OX4 and NAD

## 2023-05-15 NOTE — Discharge Instructions (Addendum)
Gargle with warm salt water.  Sour Candy (no excessive use) can help with stone removal. Brush teeth regularly. F/U with ENT if symptoms worsen.   Being treated in the ER is only one step in your treatment and safety! Even if you feel better, you still need to go to all suggested follow-up appointments and take medications as directed. Make sure to make and go to all appointments, and call your doctor if things are not going as expected.   If you have been prescribed antibiotics, take them exactly as directed. Do not stop taking them because your symptoms have improved. Take over-the-counter Tylenol or Ibuprofen with food or snacks as needed for pain.Take care and be patient!

## 2023-05-15 NOTE — ED Provider Notes (Signed)
[facility] Emergency Department Provider Note     Event Date/Time   First MD Initiated Contact with Patient 05/15/23 1714     (approximate)   History   Sore Throat (Possible Tonsil Stones)   HPI  Angel Barber is a 28 y.o. female with no significant past medical history who presents to the ED after being seen by UC for possible tonsil stones on 05/14.  Patient reports noticing "white spots" on her tonsils x 3 days prior to her UC visit.  Patient reports initially having a mild sore throat that has now progressively worsened into pain with swallowing and difficulty eating and drinking.  She reports "initially the white spots appeared on the right tonsil and spread to my right tonsil 2 hours later". Patient reports the pain is now radiating to the right side of her ear and forehead. The patient has never experienced anything like this before.  She endorses tobacco use.  She has tried ibuprofen and gargling with warm salt water with no relief.  She denies fever, chills, choking sensation, sick contacts, rash, chest pain, shortness of breath.     Physical Exam   Triage Vital Signs: ED Triage Vitals  Enc Vitals Group     BP 05/15/23 1652 (!) 141/78     Pulse Rate 05/15/23 1652 77     Resp 05/15/23 1652 18     Temp 05/15/23 1652 98.5 F (36.9 C)     Temp src --      SpO2 05/15/23 1652 (!) 87 %     Weight 05/15/23 1651 205 lb (93 kg)     Height 05/15/23 1651 5' 5.5" (1.664 m)     Head Circumference --      Peak Flow --      Pain Score 05/15/23 1651 10     Pain Loc --      Pain Edu? --      Excl. in GC? --     Most recent vital signs: Vitals:   05/15/23 1652 05/15/23 1824  BP: (!) 141/78 (!) 141/85  Pulse: 77 71  Resp: 18 18  Temp: 98.5 F (36.9 C) 97.9 F (36.6 C)  SpO2: (!) 87% 100%   General: Alert and oriented. INAD.  Skin:  Warm, dry and intact. No rashes or lesions noted.     Head:  NCAT.  Eyes:  PERRLA. EOMI. Conjunctivae  clear. Ears:  TMs dull bilaterally. No bulging, erythema  Nose:   No rhinorrhea. Mouth/Throat: Tonsillar edema bilaterally with several ~1-60mm white pebble like appearance  No exudates.  Uvula is midline. Neck:   Cervical lymphadenopathy noted.  CV:  Good peripheral perfusion. RRR.  RESP:  Normal effort. LCTAB.   ABD:  No distention.    ED Results / Procedures / Treatments   Labs (all labs ordered are listed, but only abnormal results are displayed) Labs Reviewed  GROUP A STREP BY PCR   RADIOLOGY  Not indicated No results found.   PROCEDURES:  Critical Care performed: No  Procedures   MEDICATIONS ORDERED IN ED: Medications  lidocaine (XYLOCAINE) 2 % viscous mouth solution 15 mL (15 mLs Mouth/Throat Given 05/15/23 1852)    IMPRESSION / MDM / ASSESSMENT AND PLAN / ED COURSE  I reviewed the triage vital signs and the nursing notes.                             Differential  diagnosis includes, but is not limited to, tonsillolith, strep pharyngitis, peritonsillar abscess, URI  Patient's presentation is most consistent with acute, uncomplicated illness.  28 year old female presents to ED for evaluation of an acute sore throat with progression and evidence of presenting tonsil stones.  See HPI.  Rapid strep test is negative.  Can rule out strep pharyngitis.  Given physical exam findings patient's symptoms are clinically consistent with tonsil stones.  I will give patient reassurance of resolution and spontaneous removal.  Given the fairly small size and each dome visualized I do believe there is a high resolution and change that each dose can be removed on its own.  I will provide reassurance with patient and family member.  I will give her lidocaine viscous mouth solution to help with pain in the ED.    Patient is in satisfactory and stable condition at discharge.  Patient's diagnosis is consistent with tonsillolith. Patient will be discharged home with prescriptions for Magic  mouthwash with lidocaine solution and Keflex for prophylactic coverage. Patient is to follow up with ENT or primary care as needed or otherwise directed. Patient is given ED precautions to return to the ED for any worsening or new symptoms.    FINAL CLINICAL IMPRESSION(S) / ED DIAGNOSES   Final diagnoses:  Tonsillolith     Rx / DC Orders   ED Discharge Orders          Ordered    magic mouthwash w/lidocaine SOLN  4 times daily        05/15/23 1855    cephALEXin (KEFLEX) 500 MG capsule  4 times daily        05/15/23 1855             Note:  This document was prepared using Dragon voice recognition software and may include unintentional dictation errors.    Romeo Apple, Stony Stegmann A, PA-C 05/16/23 5638    Trinna Post, MD 05/18/23 [phone]

## 2023-06-30 ENCOUNTER — Emergency Department: Payer: Medicaid Other

## 2023-06-30 ENCOUNTER — Emergency Department
Admission: EM | Admit: 2023-06-30 | Discharge: 2023-06-30 | Disposition: A | Discharge status: Home / Self Care | Payer: Medicaid Other | Attending: Emergency Medicine | Admitting: Emergency Medicine

## 2023-06-30 ENCOUNTER — Other Ambulatory Visit: Payer: Self-pay

## 2023-06-30 DIAGNOSIS — S8991XA Unspecified injury of right lower leg, initial encounter: Secondary | ICD-10-CM | POA: Diagnosis present

## 2023-06-30 DIAGNOSIS — M25511 Pain in right shoulder: Secondary | ICD-10-CM | POA: Diagnosis not present

## 2023-06-30 DIAGNOSIS — S0990XA Unspecified injury of head, initial encounter: Secondary | ICD-10-CM | POA: Insufficient documentation

## 2023-06-30 DIAGNOSIS — S8011XA Contusion of right lower leg, initial encounter: Secondary | ICD-10-CM | POA: Diagnosis not present

## 2023-06-30 DIAGNOSIS — M25552 Pain in left hip: Secondary | ICD-10-CM | POA: Diagnosis not present
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
## 2023-06-30 NOTE — ED Notes (Signed)
Pt verbalizes understanding of discharge instructions. Opportunity for questioning and answers were provided. Pt discharged from ED to home with mother.   ? ?

## 2023-06-30 NOTE — Discharge Instructions (Addendum)
You can take Tylenol and ibuprofen as needed for pain.  Please follow-up with orthopedics if you do not have a resolution of your symptoms in a week.
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
